# Patient Record
Sex: Female | Born: 1990 | Race: Black or African American | Hispanic: No | Marital: Single | Smoking: Never smoker
Health system: Southern US, Community
[De-identification: ages and names within clinical notes are randomized; demographics above are authoritative.]

## PROBLEM LIST (undated history)

## (undated) DIAGNOSIS — E538 Deficiency of other specified B group vitamins: Secondary | ICD-10-CM

## (undated) DIAGNOSIS — M419 Scoliosis, unspecified: Secondary | ICD-10-CM

## (undated) HISTORY — DX: Deficiency of other specified B group vitamins: E53.8

## (undated) HISTORY — PX: HAMMER TOE SURGERY: SHX385

## (undated) HISTORY — DX: Scoliosis, unspecified: M41.9

---
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
## 2011-04-09 ENCOUNTER — Emergency Department (HOSPITAL_COMMUNITY): Payer: BC Managed Care – HMO

## 2011-04-09 ENCOUNTER — Emergency Department (HOSPITAL_COMMUNITY)
Admission: EM | Admit: 2011-04-09 | Discharge: 2011-04-09 | Disposition: A | Discharge status: Home / Self Care | Payer: BC Managed Care – HMO | Attending: Emergency Medicine | Admitting: Emergency Medicine

## 2011-04-09 DIAGNOSIS — E876 Hypokalemia: Secondary | ICD-10-CM | POA: Insufficient documentation

## 2011-04-09 DIAGNOSIS — R55 Syncope and collapse: Secondary | ICD-10-CM | POA: Insufficient documentation

## 2011-04-09 DIAGNOSIS — E860 Dehydration: Secondary | ICD-10-CM | POA: Insufficient documentation

## 2011-04-09 DIAGNOSIS — R42 Dizziness and giddiness: Secondary | ICD-10-CM | POA: Insufficient documentation

## 2011-04-09 LAB — POCT I-STAT, CHEM 8
BUN: 14 mg/dL (ref 6–23)
Chloride: 105 mEq/L (ref 96–112)
Creatinine, Ser: 1 mg/dL (ref 0.50–1.10)
Sodium: 138 mEq/L (ref 135–145)
TCO2: 23 mmol/L (ref 0–100)

## 2011-04-09 LAB — URINALYSIS, ROUTINE W REFLEX MICROSCOPIC
Bilirubin Urine: NEGATIVE
Glucose, UA: NEGATIVE mg/dL
Hgb urine dipstick: NEGATIVE
Protein, ur: NEGATIVE mg/dL
Specific Gravity, Urine: 1.022 (ref 1.005–1.030)
Urobilinogen, UA: 0.2 mg/dL (ref 0.0–1.0)

## 2011-04-09 MED ORDER — POTASSIUM CHLORIDE CRYS ER 20 MEQ PO TBCR
40.0000 meq | EXTENDED_RELEASE_TABLET | Freq: Once | ORAL | Status: AC
  Administered 2011-04-09: 40 meq via ORAL
  Filled 2011-04-09: qty 2

## 2011-04-09 NOTE — ED Notes (Signed)
Pt was at school working out in the gym and states that everything went black and she passed out, it was witnessed but noone can tell us how long she was out

## 2011-04-09 NOTE — ED Provider Notes (Signed)
Medical screening examination/treatment/procedure(s) were performed by non-physician practitioner and as supervising physician I was immediately available for consultation/collaboration. Devoria Albe, MD, Armando Gang   Ward Givens, MD 04/09/11 2156

## 2011-04-09 NOTE — ED Provider Notes (Signed)
History     CSN: [id number] Arrival date & time: 04/09/2011  7:05 PM   First MD Initiated Contact with Patient 04/09/11 2003      Chief Complaint  Patient presents with  . Loss of Consciousness    (Consider location/radiation/quality/duration/timing/severity/associated sxs/prior treatment) The history is provided by the patient and a parent.  Pt is otherwise healthy young female who presents with complaints of syncopal episode while participating in work out group.  Pt has been to same work out in past.  Her syncopal episode was brief and preceded by lightheadedness and spots in vision.  Pt also felt some nausea.  She denies CP, palpitations, hemoptysis, headache.  She has felt well recently with no fever, chills, cough, N/V.  No family hx of earlier cardiac death or significant cardiac problems. Pt feels well now and denies any pain or head injury.  Pt denies hx of seizures, no incontinence, or tongue biting.  History reviewed. No pertinent past medical history.  History reviewed. No pertinent past surgical history.  History reviewed. No pertinent family history.  History  Substance Use Topics  . Smoking status: Never Smoker   . Smokeless tobacco: Not on file  . Alcohol Use: No    OB History    Grav Para Term Preterm Abortions TAB SAB Ect Mult Living                  Review of Systems  Constitutional: Negative for fever, chills, diaphoresis and fatigue.  Respiratory: Negative for cough, chest tightness, shortness of breath and wheezing.   Cardiovascular: Negative for chest pain, palpitations and leg swelling.  Gastrointestinal: Negative for vomiting.  Neurological: Positive for dizziness, syncope and light-headedness. Negative for numbness.  All other systems reviewed and are negative.    Allergies  Doxycycline  Home Medications   Current Outpatient Rx  Name Route Sig Dispense Refill  . THERA M PLUS PO TABS Oral Take 1 tablet by mouth daily.        BP 112/69   Pulse 83  Temp(Src) 98 F (36.7 C) (Oral)  Resp 18  SpO2 100%  LMP 03/11/2011  Physical Exam  Constitutional: She is oriented to person, place, and time. She appears well-developed and well-nourished. No distress.  HENT:  Head: Normocephalic and atraumatic.  Nose: Nose normal.  Mouth/Throat: Oropharynx is clear and moist.  Eyes: Conjunctivae are normal. Pupils are equal, round, and reactive to light.  Neck: Normal range of motion. No tracheal deviation present. No thyromegaly present.  Cardiovascular: Normal rate, regular rhythm and normal heart sounds.  Exam reveals no gallop and no friction rub.   No murmur heard. Pulmonary/Chest: Effort normal and breath sounds normal. No stridor.  Abdominal: Soft. Bowel sounds are normal. She exhibits no distension. There is no tenderness.  Musculoskeletal: Normal range of motion. She exhibits no edema.  Neurological: She is alert and oriented to person, place, and time.  Skin: Skin is warm and dry. No rash noted. No erythema.  Psychiatric: She has a normal mood and affect.    ED Course  Procedures (including critical care time)  Pt seen and evaluated.  Initial work up started.  Pt without symptoms at this time. Pt is PERC negative.    Labs Reviewed  URINALYSIS, ROUTINE W REFLEX MICROSCOPIC - Abnormal; Notable for the following:    Ketones, ur 15 (*)    All other components within normal limits  POCT I-STAT, CHEM 8 - Abnormal; Notable for the following:  Potassium 3.4 (*)    All other components within normal limits  POCT PREGNANCY, URINE  I-STAT, CHEM 8  POCT PREGNANCY, URINE   Dg Chest 2 View  04/09/2011  *RADIOLOGY REPORT*  Clinical Data: Syncope  CHEST - 2 VIEW  Comparison: 06/07/2005  Findings: Mild bronchitic changes.  Clear lungs.  Normal heart size.  No pleural effusion.  No pneumothorax.  IMPRESSION: Bronchitic changes.  Original Report Authenticated By: Donavan Burnet, M.D.     1. Vasovagal syncope       MDM   Pt  with single episode of syncope.  No symptoms now.  Negative urine pregnancy.  Slight dehydration on UA.  Slight hypokalemia with potassium given.  Normal H/H.  Pt meets [place] syncope score for low risk of adverse outcome.   ECG  Date: 04/09/2011  Rate: 65  Rhythm: normal sinus rhythm  QRS Axis: normal  Intervals: normal  ST/T Wave abnormalities: normal  Conduction Disutrbances:none  Narrative Interpretation:   Old EKG Reviewed: none available       Angus Seller, PA 04/09/11 2134  Angus Seller, PA 04/09/11 2139  Angus Seller, PA 04/09/11 2140

## 2011-04-09 NOTE — ED Notes (Signed)
Pt is not in the room at this time. Will get labs when pt returns. RN aware

## 2013-11-11 ENCOUNTER — Emergency Department (HOSPITAL_COMMUNITY)
Admission: EM | Admit: 2013-11-11 | Discharge: 2013-11-12 | Disposition: A | Discharge status: Home / Self Care | Payer: BC Managed Care – PPO | Attending: Emergency Medicine | Admitting: Emergency Medicine

## 2013-11-11 ENCOUNTER — Encounter (HOSPITAL_COMMUNITY): Payer: Self-pay | Admitting: Emergency Medicine

## 2013-11-11 DIAGNOSIS — N12 Tubulo-interstitial nephritis, not specified as acute or chronic: Secondary | ICD-10-CM | POA: Insufficient documentation

## 2013-11-11 DIAGNOSIS — R509 Fever, unspecified: Secondary | ICD-10-CM | POA: Insufficient documentation

## 2013-11-11 DIAGNOSIS — K5900 Constipation, unspecified: Secondary | ICD-10-CM | POA: Insufficient documentation

## 2013-11-11 DIAGNOSIS — Z888 Allergy status to other drugs, medicaments and biological substances status: Secondary | ICD-10-CM | POA: Insufficient documentation

## 2013-11-11 NOTE — ED Notes (Signed)
Pt states for the past couple of days she has been having abd pain  Pt states it initially started on her right side but over the coarse of the past day or so it has worked its way across her abdomen now both sides hurt  Pt denies N/V/D  Pt states she has not had a bowel movement in over a week   Mother states pt has been running a fever today

## 2013-11-12 LAB — COMPREHENSIVE METABOLIC PANEL
ALBUMIN: 4.2 g/dL (ref 3.5–5.2)
ALK PHOS: 53 U/L (ref 39–117)
ALT: 24 U/L (ref 0–35)
AST: 33 U/L (ref 0–37)
BILIRUBIN TOTAL: 0.9 mg/dL (ref 0.3–1.2)
BUN: 17 mg/dL (ref 6–23)
CHLORIDE: 103 meq/L (ref 96–112)
CO2: 23 mEq/L (ref 19–32)
Calcium: 9.5 mg/dL (ref 8.4–10.5)
Creatinine, Ser: 0.98 mg/dL (ref 0.50–1.10)
GFR calc Af Amer: >90 mL/min (ref >90)
GFR calc non Af Amer: 81 mL/min — ABNORMAL LOW (ref >90)
Glucose, Bld: 112 mg/dL — ABNORMAL HIGH (ref 70–99)
POTASSIUM: 3.8 meq/L (ref 3.7–5.3)
SODIUM: 138 meq/L (ref 137–147)
Total Protein: 8 g/dL (ref 6.0–8.3)

## 2013-11-12 LAB — CBC WITH DIFFERENTIAL/PLATELET
BASOS ABS: 0 10*3/uL (ref 0.0–0.1)
BASOS PCT: 0 % (ref 0–1)
Eosinophils Absolute: 0 10*3/uL (ref 0.0–0.7)
Eosinophils Relative: 0 % (ref 0–5)
HCT: 35 % — ABNORMAL LOW (ref 36.0–46.0)
HEMOGLOBIN: 11.8 g/dL — AB (ref 12.0–15.0)
Lymphocytes Relative: 3 % — ABNORMAL LOW (ref 12–46)
Lymphs Abs: 0.5 10*3/uL — ABNORMAL LOW (ref 0.7–4.0)
MCH: 31.1 pg (ref 26.0–34.0)
MCHC: 33.7 g/dL (ref 30.0–36.0)
MCV: 92.1 fL (ref 78.0–100.0)
Monocytes Absolute: 1.4 10*3/uL — ABNORMAL HIGH (ref 0.1–1.0)
Monocytes Relative: 9 % (ref 3–12)
NEUTROS ABS: 13.1 10*3/uL — AB (ref 1.7–7.7)
NEUTROS PCT: 88 % — AB (ref 43–77)
Platelets: 255 10*3/uL (ref 150–400)
RBC: 3.8 MIL/uL — ABNORMAL LOW (ref 3.87–5.11)
RDW: 12.2 % (ref 11.5–15.5)
WBC: 15 10*3/uL — ABNORMAL HIGH (ref 4.0–10.5)

## 2013-11-12 LAB — URINALYSIS, ROUTINE W REFLEX MICROSCOPIC
Bilirubin Urine: NEGATIVE
GLUCOSE, UA: NEGATIVE mg/dL
KETONES UR: NEGATIVE mg/dL
Nitrite: POSITIVE — AB
Specific Gravity, Urine: 1.019 (ref 1.005–1.030)
Urobilinogen, UA: 1 mg/dL (ref 0.0–1.0)
pH: 6.5 (ref 5.0–8.0)

## 2013-11-12 LAB — URINE MICROSCOPIC-ADD ON

## 2013-11-12 LAB — PREGNANCY, URINE: Preg Test, Ur: NEGATIVE

## 2013-11-12 LAB — LIPASE, BLOOD: Lipase: 26 U/L (ref 11–59)

## 2013-11-12 MED ORDER — DEXTROSE 5 % IV SOLN
1.0000 g | Freq: Once | INTRAVENOUS | Status: AC
  Administered 2013-11-12: 1 g via INTRAVENOUS
  Filled 2013-11-12: qty 10

## 2013-11-12 MED ORDER — SODIUM CHLORIDE 0.9 % IV BOLUS (SEPSIS)
1000.0000 mL | Freq: Once | INTRAVENOUS | Status: AC
  Administered 2013-11-12: 1000 mL via INTRAVENOUS

## 2013-11-12 MED ORDER — CIPROFLOXACIN IN D5W 400 MG/200ML IV SOLN
400.0000 mg | Freq: Once | INTRAVENOUS | Status: AC
  Administered 2013-11-12: 400 mg via INTRAVENOUS
  Filled 2013-11-12: qty 200

## 2013-11-12 MED ORDER — CIPROFLOXACIN HCL 500 MG PO TABS: 500.0000 mg | ORAL_TABLET | Freq: Two times a day (BID) | ORAL | Status: DC

## 2013-11-12 MED ORDER — ACETAMINOPHEN 325 MG PO TABS
650.0000 mg | ORAL_TABLET | Freq: Once | ORAL | Status: AC
  Administered 2013-11-12: 650 mg via ORAL
  Filled 2013-11-12: qty 2

## 2013-11-12 MED ORDER — MORPHINE SULFATE 4 MG/ML IJ SOLN
4.0000 mg | Freq: Once | INTRAMUSCULAR | Status: AC
  Administered 2013-11-12: 4 mg via INTRAVENOUS
  Filled 2013-11-12: qty 1

## 2013-11-12 MED ORDER — ONDANSETRON 8 MG PO TBDP: 8.0000 mg | ORAL_TABLET | Freq: Three times a day (TID) | ORAL | Status: DC | PRN

## 2013-11-12 MED ORDER — OXYCODONE-ACETAMINOPHEN 5-325 MG PO TABS: 2.0000 | ORAL_TABLET | ORAL | Status: DC | PRN

## 2013-11-12 MED ORDER — POLYETHYLENE GLYCOL 3350 17 G PO PACK: 17.0000 g | PACK | Freq: Every day | ORAL | Status: DC

## 2013-11-12 MED ORDER — ONDANSETRON HCL 4 MG/2ML IJ SOLN
4.0000 mg | Freq: Once | INTRAMUSCULAR | Status: AC
Start: 2013-11-12 — End: 2013-11-12
  Administered 2013-11-12: 4 mg via INTRAVENOUS
  Filled 2013-11-12: qty 2

## 2013-11-12 NOTE — ED Provider Notes (Signed)
CSN: [id number]     Arrival date & time 11/11/13  2304 History   First MD Initiated Contact with Patient 11/12/13 0024     Chief Complaint  Patient presents with  . Constipation  . Abdominal Pain  . Fever     (Consider location/radiation/quality/duration/timing/severity/associated sxs/prior Treatment) HPI 23 year old female presents to the emergency room from home with complaints of abdominal pain fever nausea and constipation.  Patient reports right-sided abdominal pain started 3-4 days ago.  Pain is now radiated around to her back.  Temp at home to 102.  Last bowel movement about a week ago, which is unusual.  She reports normally she has a bowel movement every 2-3 days.  Patient reports she's had increased urination, with some dysuria.  She denies any vaginal discharge.  No cough no chest pain.  No sick contacts no unusual foods no travel.   History reviewed. No pertinent past medical history. History reviewed. No pertinent past surgical history. Family History  Problem Relation Age of Onset  . Hypertension Other    History  Substance Use Topics  . Smoking status: Never Smoker   . Smokeless tobacco: Not on file  . Alcohol Use: No   OB History   Grav Para Term Preterm Abortions TAB SAB Ect Mult Living                 Review of Systems  See History of Present Illness; otherwise all other systems are reviewed and negative   Allergies  Doxycycline  Home Medications   Prior to Admission medications   Medication Sig Start Date End Date Taking? Authorizing Provider  Multiple Vitamins-Minerals (MULTIVITAMINS THER. W/MINERALS) TABS Take 1 tablet by mouth daily.      Historical Provider, MD   BP 109/62  Pulse 100  Temp(Src) 101.2 F (38.4 C) (Oral)  Resp 16  Ht 5\' 4"  (1.626 m)  Wt 127 lb (57.607 kg)  BMI 21.79 kg/m2  SpO2 99%  LMP 10/20/2013 Physical Exam  Nursing note and vitals reviewed. Constitutional: She is oriented to person, place, and time. She appears  well-developed and well-nourished.  HENT:  Head: Normocephalic and atraumatic.  Nose: Nose normal.  Mouth/Throat: Oropharynx is clear and moist.  Eyes: Conjunctivae and EOM are normal. Pupils are equal, round, and reactive to light.  Neck: Normal range of motion. Neck supple. No JVD present. No tracheal deviation present. No thyromegaly present.  Cardiovascular: Normal rate, regular rhythm, normal heart sounds and intact distal pulses.  Exam reveals no gallop and no friction rub.   No murmur heard. Pulmonary/Chest: Effort normal and breath sounds normal. No stridor. No respiratory distress. She has no wheezes. She has no rales. She exhibits no tenderness.  Abdominal: Soft. Bowel sounds are normal. She exhibits no distension and no mass. There is no tenderness. There is no rebound and no guarding.  Right CVA tenderness, right upper and lower abdominal pain without rebound or guarding.  Musculoskeletal: Normal range of motion. She exhibits no edema and no tenderness.  Lymphadenopathy:    She has no cervical adenopathy.  Neurological: She is alert and oriented to person, place, and time. She exhibits normal muscle tone. Coordination normal.  Skin: Skin is warm and dry. No rash noted. No erythema. No pallor.  Psychiatric: She has a normal mood and affect. Her behavior is normal. Judgment and thought content normal.    ED Course  Procedures (including critical care time) Labs Review Labs Reviewed  CBC WITH DIFFERENTIAL - Abnormal; Notable for  the following:    WBC 15.0 (*)    RBC 3.80 (*)    Hemoglobin 11.8 (*)    HCT 35.0 (*)    Neutrophils Relative % 88 (*)    Neutro Abs 13.1 (*)    Lymphocytes Relative 3 (*)    Lymphs Abs 0.5 (*)    Monocytes Absolute 1.4 (*)    All other components within normal limits  COMPREHENSIVE METABOLIC PANEL - Abnormal; Notable for the following:    Glucose, Bld 112 (*)    GFR calc non Af Amer 81 (*)    All other components within normal limits   URINALYSIS, ROUTINE W REFLEX MICROSCOPIC - Abnormal; Notable for the following:    Color, Urine ORANGE (*)    APPearance TURBID (*)    Hgb urine dipstick LARGE (*)    Protein, ur >300 (*)    Nitrite POSITIVE (*)    Leukocytes, UA LARGE (*)    All other components within normal limits  URINE MICROSCOPIC-ADD ON - Abnormal; Notable for the following:    Squamous Epithelial / LPF FEW (*)    Bacteria, UA MANY (*)    All other components within normal limits  URINE CULTURE  LIPASE, BLOOD  PREGNANCY, URINE    Imaging Review No results found.   EKG Interpretation None      MDM   Final diagnoses:  Pyelonephritis  Constipation    23 year old female with right flank and abdominal pain.  Differential includes pyelonephritis, cholecystitis, appendicitis, gastroenteritis, colitis.  Plan for labs, Tylenol fluids pain and nausea medicine and we'll reassess.   3:46 AM Patient feeling better after medications.  No further vomiting.  Labs show elevated white blood cell count and urine positive for urinary tract infection.  Given right flank pain, suspect pyelonephritis.  As patient is tolerating by mouth fluids, feel that she is okay for discharge at this time, and she and her parents have been given strict return precautions  Olivia Mackielga M Demarus Latterell, MD 11/12/13 [phone]

## 2013-11-12 NOTE — Discharge Instructions (Signed)
Pyelo  Take antibiotics as prescribed.  Take pain and nausea medication as needed.  Return to the ER if you are unable to keep down the antibiotics, if you have continued fever, vomiting despite the medication, or other concerns.  Follow up with your doctor for recheck in 3-5 days.  If you do not have a doctor, follow up with one of the numbers listed  PAIN ACETAMINOPHEN OXYCODONE  PAIN ACETAMINOPHEN OXYCODONE: You have been given a medication that contains acetaminophen and oxycodone.      This medication is used to relieve pain.     DO NOT take this medication if you have liver disease or drink alcohol on a daily basis.     DO NOT take this medication if you are taking other over-the-counter medications that contain Tylenol or acetaminophen (the active ingredient in Tylenol).     If you have side-effects that you think are caused by this medicine, tell your doctor.     DO NOT drink alcoholic beverages while taking this medicine.     If you become dizzy, sit or lie down at the first signs.  You should be careful going up and down stairs.     If you are pregnant or breastfeeding, notify your doctor before taking this medication.     Keep this medication out of the reach of children.  Always keep this medication in child-proof containers.  DO NOT give your medication to anyone else. This medication can be HABIT-FORMING.  Discontinue use when no longer needed and never give this medication to others.  You have been given a medication, or a prescription for a medication, that causes drowsiness or dizziness.  DO NOT drive a car, operate machinery, or perform jobs that require you to be alert until you know how you are going to react to this medicine.  THESE INSTRUCTIONS ARE NOT COMPREHENSIVE (complete):  Ask your pharmacist for additional information and precautions for this medication.   GI ANTIEMETIC  GI ANTIEMETIC: You have been given a prescription for a medication for nausea and  vomiting.      It is OK to take this medication if you are pregnant.  Be sure to tell your regular doctor or obstetrician [facility] doctor) that you have been taking this medication.     Take this medication as directed.     If you are taking phenobarbital, narcotic pain medications, antidepressants, or sleeping pills your dosage may need to be adjusted.  Be sure to inform your doctor of all the other medications that you are taking.     DO NOT take this medication if you have liver disease or heart disease.     DO NOT take pain killers (narcotic medication) unless specifically instructed to do so by your doctor     DO NOT drink alcoholic beverages while taking this medicine.     If you develop any reactions that you believe may be from the medication be sure to tell your doctor or return to the ER (Some reactions may include:  dizziness, shaking, visual disturbances, nervousness, fainting, rash).     If you become dizzy, sit or lie down at the first signs.  You should be careful going up and down stairs.     Keep this medication out of the reach of children.  Always keep this medication in child-proof containers.  DO NOT give your medication to anyone else. You have been given a medication, or a prescription for a medication, that causes drowsiness  or dizziness.  DO NOT drive a car, operate machinery, ride a bike, or perform jobs that require you to be alert until you know how you are going to react to this medication.  THESE INSTRUCTIONS ARE NOT COMPREHENSIVE (complete):  Ask your pharmacist for additional information and precautions for this medication.   ANTIBIOTIC FLUOROQUINOLONES  ANTIBIOTIC FLUOROQUINOLONES: You have been prescribed an antibiotic that belongs to a class of drugs called Fluoroquinolones.      Take this medication exactly as prescribed.  If you forget a dose, do not double your next dose.     This medication treats many kinds of infections of the skin, bone, stomach,  brain, blood, lungs, ear, and urinary tract.  It also treats certain sexually transmitted diseases.     DO NOT take this medication if you have an allergy to it or to other similar medications.     Take this medication until it is gone.  DO NOT stop taking this medication after a few doses, even if you are feeling better.     Keep this medication out of the reach of children.  Always keep this medication in child-proof containers.  DO NOT give your medication to anyone else.     You may take this medication with food or milk.  To help prevent complications, drink extra fluid while taking this medication. Keep this medication away from children.  IT IS VERY IMPORTANT that you finish all the medication in this prescription, since the medicine is used to treat an ongoing infection in your body.  If you are taking any of the following medications, please notify your doctor  before taking this medication:      Blood Thinners (Coumadin), Cyclosporin, Arsenic Trioxide, Erythromycin, medicines for depression or mental illness such as Prozac, Amitriptyline, Imipramine, Haldol, Geodon, or Mellaril, anti-inflammatory drugs such as Ibuprofen, Aspirin, Aleve, or Relafen, steroids, diuretics or "water pills", or medications used to treat abnormal heart rhythms such as Amiodarone, Betapace, Corvert, Norpace, or Sotalol.     If you are using antacids, multivitamins, or sucralfate, take these medications at least 8 hours before or 4 hours after taking this medication. Make sure your doctor knows if you are pregnant or breastfeeding, have a history of heart disease, heart rhythm problems, kidney disease, liver disease, low potassium, stroke, seizures, or a family history of heart rhythm disorders.      Possible side-effects of this medication are an allergic reaction (itching or hives, swelling of the face or hands, chest tightness, trouble breathing), chest pain, heart rhythm changes, diarrhea, seizures,  yellowing of the eyes or skin, and unusual bleeding or bruising.  Please contact your doctor immediately if you experience any of these side-effects.     Some less serious side-effects are dizziness, nervousness, anxiety, agitation, muscle or joint pain, nausea or vomiting, sores or white patches in your mouth, and vaginal itching or discharge.  Notify your doctor if you experience any of these symptoms. THESE INSTRUCTIONS ARE NOT COMPREHENSIVE (complete):  Ask your pharmacist for additional information and precautions for this medication.   PYELONEPHRITIS  PYELONEPHRITIS: You have been diagnosed with pyelonephritis, also known as a kidney infection.  Pyelonephritis is a bacterial infection in your kidneys. Symptoms include fever, chills, nausea and vomiting, back pain on one or both sides, and sometimes difficulty urinating. You may also feel very weak. The diagnosis is made based on your symptoms and a test of your urine.  Pyelonephritis most commonly occurs when a lower urinary tract  infection (bladder infection) ascends (climbs up) the urinary tract and gets into the kidney. Early treatment of a bladder infection is important to prevent pyelonephritis and the possibility of kidney damage.  Pyelonephritis is treated with antibiotics, pain and fever medications, and fluids. Some patients require an "IV" if they have nausea or vomiting and cannot keep down the antibiotics, or if they arent improving after 2-3 days of oral (by mouth) medications.  Most patients do not need to be admitted to the hospital for pyelonephritis.  A few patients who dont do well with the oral (by mouth) antibiotics or become sicker despite treatment may need to return to be admitted to the hospital.  YOU SHOULD SEEK MEDICAL ATTENTION IMMEDIATELY, EITHER HERE OR AT THE NEAREST EMERGENCY DEPARTMENT, IF ANY OF THE FOLLOWING OCCURS:      Failure to improve after 2-3 days of antibiotics.     You develop nausea or  vomiting and are unable to keep down medications or fluids.     Increased weakness or lightheadedness.     You develop any progressive or worsening symptoms or any other concerns.  IMPORTANCE OF PRIMARY CARE DOCTOR (EDU)  IMPORTANCE OF PRIMARY CARE DOCTOR (EDU): You have been given instructions to follow up with a primary care physician.  A primary care physician is a doctor who helps with your health maintenance. For example, he or she provides yearly health exams to help determine your general well-being along with regular check-ups to help to identify potential health problems.  Your primary care physician serves as a main resource on all aspects of your health. In addition to treating existing medical conditions, this physician monitors your health over time. Your primary doctor can help you to recognize symptoms, or changes in your body that could be signs of new illness. Primary care physicians can look at the big picture, including your lifestyle and family history. They can help plan the best ways of staying healthy and leading a long, productive life. They are also an important part in making referrals to specialists (such as doctors who specialize in specific disease conditions such as diabetes, heart disease, etc.).  There are many types of physicians who provide primary care. They all offer the benefits of a lasting, personal relationship based upon mutual trust and a thorough knowledge of an individual person. They also provide a wide range of healthcare services.      Family Medicine physicians provide comprehensive care for all family members, from newborns through older adults.     Internal Medicine physicians specialize in meeting the complete healthcare needs of adults, from teenagers through seniors, providing both primary and advanced levels of care.     Obstetrician/gynecologists often serve as primary physicians for women, performing routine physicals and health screenings  in addition to obstetrical and gynecological care.     Pediatricians are experts in primary care for children, usually from infancy through the teen years. Primary care doctors may be either MDs or DOs. With today's modern medical training, the differences between an MD (Medical Doctor) and a D.O. (Doctor of Osteopathic Medicine) are minimal. Both MD's and DOs go to medical school and complete residencies in various medical specialties.  If you do not have a primary care physician, it takes a little homework and determination on your part. There are several options available in selecting the most appropriate doctor for your care. There are referrals lines in your local area as well as specialists that work with your specific health care  plan. Many people find a physician through word-of-mouth, asking their friends, neighbors or relatives. There are also referral lines in your local area. Hospital physician referral services are also another option. Your health care plan may also offer referral services and most health plans offer the "Ask A Nurse" service. Referral services offer backgrounds of potential physicians, their educational and practice history, age range, office locations and hours, and the types of insurance coverage that they accept.  When you have decided which doctor may be right for you, make an appointment to ask questions about issues that are important to you. Frequently asked questions include the following:      Is the doctor on staff at a hospital? Which hospital?     What is the doctor's educational background?     Does the doctor specialize in certain areas of medicine?     How many years has his or her practice been established?     Is the doctor in practice by himself or herself, or in a group practice?     Is his or her office conveniently located?     What hours are available for appointments?     What types of insurance coverage does the doctor accept?     If  you're on Medicare or Medicaid, does the doctor accept these plans?     How far in advance do you have to make an appointment? Are same-day appointments available?     How does the doctor handle situations when you need to see a doctor urgently?     What is the doctor's fee schedule? When is payment expected and how can it be made? When seeing a patient for the first time in a non-emergency situation, most doctors will begin a medical chart. This chart includes information about your health history. This record should include your present state of health, personal statistics (age, height, weight, occupation, whether you're a smoker or non-smoker), and your family history.  Establishing a GOOD RAPPORT (relationship) with your family doctor is EXTREMELY important! A PCP (Primary Care Physician) is the cornerstone of your care and should be the first person you call with any health concerns or problems. Being an established patient is VERY important so that you can be seen quickly when an illness or injury does occur. Plan ahead and make an appointment with your chosen physician to become an established patient of his or her practice.   Emergency Department Resource Guide 1) Find a Doctor and Pay Out of Pocket Although you won't have to find out who is covered by your insurance plan, it is a good idea to ask around and get recommendations. You will then need to call the office and see if the doctor you have chosen will accept you as a new patient and what types of options they offer for patients who are self-pay. Some doctors offer discounts or will set up payment plans for their patients who do not have insurance, but you will need to ask so you aren't surprised when you get to your appointment.  2) Contact Your Local Health Department Not all health departments have doctors that can see patients for sick visits, but many do, so it is worth a call to see if yours does. If you don't know where your  local health department is, you can check in your phone book. The CDC also has a tool to help you locate your state's health department, and many state websites also have listings of  all of their local health departments.  3) Find a Walk-in Clinic If your illness is not likely to be very severe or complicated, you may want to try a walk in clinic. These are popping up all over the country in pharmacies, drugstores, and shopping centers. They're usually staffed by nurse practitioners or physician assistants that have been trained to treat common illnesses and complaints. They're usually fairly quick and inexpensive. However, if you have serious medical issues or chronic medical problems, these are probably not your best option.  No Primary Care Doctor: - Call Health Connect at  [phone] - they can help you locate a primary care doctor that  accepts your insurance, provides certain services, etc. - Physician Referral Service- [phone]  Chronic Pain Problems: Organization         Address  Phone   Notes  [facility]  [phone] Patients need to be referred by their primary care doctor.   Medication Assistance: Organization         Address  Phone   Notes  [facility] Medication [facility] [address] [place], [place] [address] [phone] --Must be a resident of [facility] -- Must have NO insurance coverage whatsoever (no Medicaid/ Medicare, etc.) -- The pt. MUST have a primary care doctor that directs their care regularly and follows them in the community   [facility]  [phone]   [employer]  [phone]    Agencies that provide inexpensive medical care: Organization         Address  Phone   Notes  [facility]  [phone]   [facility]    [phone]   [facility] [address] [place], [place] [address] [phone]   [facility] [address] [place]  [address], [place] [phone]   Planned Parenthood    [phone]   [facility]    [phone]   [facility]  [address], [place] Phone:  [phone], Fax:  [phone] Hours of Operation:  9 am - 6 pm, M-F.  Also accepts Medicaid/Medicare and self-pay.  [facility]  [address], [place] Phone: [phone], Fax: [phone]. Hours of Operation:  8:30 am - 5:30 pm, M-F.  Also accepts Medicaid and self-pay.  [facility] [place] [address] [place] Phone: [phone]   [facility] [address] Natasha Bence [place], [place] [phone], Ext. [phone] Mondays & Thursdays: 7-9 AM.  First 15 patients are seen on a first come, first serve basis.    Medicaid-accepting [facility] Providers:  Organization         Address  Phone   Notes  [facility] [address], [place] [phone] Also accepts self-pay patients.  [facility] [address] Laurell Josephs [place], [place]  [phone]   [facility] [address], [place] [phone]   [facility] [address] [place] [phone]   Renaye Rakers [address], [place]   [phone] Only accepts [place] patients after they have their name applied to their card.   Self-Pay (no insurance) in [facility]:  Organization         Address  Phone   Notes  Sickle  Cell Patients, [facility] [address], [place] [phone]   [facility] [address], [place] [phone]   [facility]  [address], [place] [phone]) [phone]   [facility]/Dr. Osei-Bonsu  [address] [place] [address], [place] or [phone], [place] [phone] Phone number for both [place] and [place] locations is the same.  [facility] [address], [place] [phone]) [phone]   [facility] [address] [place] [address], [place] or [address] [phone]   [facility] [address], [place] [phone]) [phone], phone; [phone]) [phone], fax Sees patients 1st and 3rd Saturday of every month.  Must not qualify for public or private insurance (i.e. Medicaid, Medicare, [place] Health Choice, Veterans' Benefits)  Household income should be no more than 200% of the poverty level The clinic cannot treat you if you are pregnant or think you are pregnant  Sexually transmitted diseases are not treated at the clinic.    Dental Care: Organization         Address  Phone  Notes  [facility] [address], [place] [phone] Accepts children up to age 22 who are enrolled in [place] or [place] Health Choice; pregnant women with a Medicaid card; and children who have applied for Medicaid or [place] Health Choice, but were declined, whose parents can pay a reduced fee at time of service.  [facility] [place]  [address], [place] [phone] Accepts children up to age 22 who are enrolled in [place] or [place] Health Choice; pregnant women with a Medicaid card; and children who have applied for Medicaid or [place] Health Choice, but were declined, whose parents can pay a reduced fee at time of service.  [facility]  [address], [place] [phone] Patients are seen by appointment only. Walk-ins are not accepted. [facility] will see patients 218 years of age and older. Monday - Tuesday (8am-5pm) Most Wednesdays (8:30-5pm) $30 per visit, cash only  [facility]  [address], [facility] [phone] Patients are seen by appointment only. Walk-ins are not  accepted. [facility] will see patients 118 years of age and older. One Wednesday Evening (Monthly: Volunteer Based).  $30 per visit, cash only  [employer] [facility] [facility]  [phone] for adults; Children under age 234, call Graduate Pediatric Dentistry at [phone]. Children aged 244-14, please call [phone] to request a pediatric application.  Dental services are provided in all areas of dental care including fillings, crowns and bridges, complete and partial dentures, implants, gum treatment, root canals, and extractions. Preventive care is also provided. Treatment is provided to both adults and children. Patients are selected via a lottery and there is often a waiting list.   [facility] [address], [place]  [phone] [email or web address]   [facility] [address], [place], [place] [phone], Ext. [phone] Second and Fourth Thursday of each month, opens at 6:30 AM; Clinic ends at 9 AM.  Patients are seen on a first-come first-served basis, and a limited number are seen during each clinic.   [facility]  [address], [place], [place] [phone]) [phone]   Eligibility Requirements  You must have lived in [place], [place], or [place] counties for at least the last three months.   You cannot be eligible for state or federal sponsored [place], including [employer], [place], or [employer].   You generally cannot be eligible for healthcare insurance through your employer.    How to apply: Eligibility screenings are held every Tuesday and Wednesday afternoon from 1:00 pm until 4:00 pm. You do not need an appointment for the interview!  [facility] [address] [place], [place] [phone]   [facility]  [phone]   [facility] Health Department  [phone]   [facility] Health Department  [phone]    Behavioral Health Resources in the  Community: Intensive Outpatient Programs Organization         Address  Phone  Notes  [facility] [address] [place], [place] [phone]   [facility] [address], [place], [place] [phone]   [facility] [address] [place], [place]  [phone]   [facility] [address]  [place], [place] [phone] or [phone]   Substance Abuse Resources Organization         Address  Phone  Notes  [facility]  [phone]   [facility]  [phone]   [facility] [place]  [phone]   Floydene Flock  [phone]   Residential & Outpatient Substance Abuse Program  [phone]   Psychological Services Organization         Address  Phone  Notes  [facility]  [phone]   [facility] Services  [phone]   [facility] [address], [place] [phone] or [phone]    Mobile Crisis Teams Organization         Address  Phone  Notes  [facility], Mobile Crisis Care Unit  [phone]   [facility]  [address] [place], [place] [phone]   Doristine Locks [address] [place] [phone]    Self-Help/Support Groups Organization         Address  Phone             Notes  [facility] - variety of support groups  336- [phone] Call for more information  Narcotics Anonymous (NA), [facility] [address], [employer] [place]  2 meetings at this location   Statistician         Address  Phone  Notes  [facility] [address],    [place]  [phone]   [facility]  [address], [place] [address], [place], [place] [phone]   [facility] [address] [place] [address] [place], [place] [phone] Admissions: 8am-3pm M-F  [facility] [address],    [place], [place] [phone]   [facility] [address] [place] [place], [place] [phone]   [facility] [address]  [place], [place] [phone]   [facility] Int[facility] [address], [place], [place] [phone]   [facility] [address] [place]  [place], [place] [phone] or [phone]   [facility] [address] [place], [place] [phone] Accepts Medicaid  [facility] [address],  [place] [phone] Substance Abuse/Addiction Treatment   [facility] Resources Organization  Address  Phone  Notes  °[facility]  [phone]   °Julie Brannon, PhD [address] [place], [place]   [phone]) [phone] or [phone]) [phone]   °[facility]   [address] °[place], [place] [phone]   °[facility] [address], [place], [place] [phone] Insurance/Medicaid/sponsorship through [facility]  °[facility] [address]                                    [place], [place] [phone] Therapy/tele-psych/case  °[facility] [address]  ° [place], [place] [phone]    °Dr. Arfeen  [phone]   °[facility] [place]  [employer] [facility] 1) [address], [place] °2) [address], [place] °3)  [address], [place] [phone] °[phone] ° °[phone]   °[place] Child Abuse Hotline [phone]) 342-1394 or [phone]) [phone] (After Hours)    ° ° ° °

## 2013-11-13 LAB — URINE CULTURE

## 2013-11-14 ENCOUNTER — Emergency Department (HOSPITAL_COMMUNITY)
Admission: EM | Admit: 2013-11-14 | Discharge: 2013-11-15 | Disposition: A | Discharge status: Home / Self Care | Payer: BC Managed Care – PPO | Attending: Emergency Medicine | Admitting: Emergency Medicine

## 2013-11-14 ENCOUNTER — Telehealth (HOSPITAL_BASED_OUTPATIENT_CLINIC_OR_DEPARTMENT_OTHER): Payer: Self-pay | Admitting: Emergency Medicine

## 2013-11-14 ENCOUNTER — Encounter (HOSPITAL_COMMUNITY): Payer: Self-pay | Admitting: Emergency Medicine

## 2013-11-14 ENCOUNTER — Emergency Department (HOSPITAL_COMMUNITY): Payer: BC Managed Care – PPO

## 2013-11-14 DIAGNOSIS — Z792 Long term (current) use of antibiotics: Secondary | ICD-10-CM | POA: Insufficient documentation

## 2013-11-14 DIAGNOSIS — Z79899 Other long term (current) drug therapy: Secondary | ICD-10-CM | POA: Insufficient documentation

## 2013-11-14 DIAGNOSIS — R109 Unspecified abdominal pain: Secondary | ICD-10-CM | POA: Insufficient documentation

## 2013-11-14 DIAGNOSIS — N12 Tubulo-interstitial nephritis, not specified as acute or chronic: Secondary | ICD-10-CM

## 2013-11-14 LAB — CBC WITH DIFFERENTIAL/PLATELET
BASOS ABS: 0 10*3/uL (ref 0.0–0.1)
Basophils Relative: 0 % (ref 0–1)
Eosinophils Absolute: 0.1 10*3/uL (ref 0.0–0.7)
Eosinophils Relative: 1 % (ref 0–5)
HCT: 34.8 % — ABNORMAL LOW (ref 36.0–46.0)
Hemoglobin: 11.6 g/dL — ABNORMAL LOW (ref 12.0–15.0)
LYMPHS PCT: 16 % (ref 12–46)
Lymphs Abs: 1.4 10*3/uL (ref 0.7–4.0)
MCH: 31.1 pg (ref 26.0–34.0)
MCHC: 33.3 g/dL (ref 30.0–36.0)
MCV: 93.3 fL (ref 78.0–100.0)
Monocytes Absolute: 1 10*3/uL (ref 0.1–1.0)
Monocytes Relative: 12 % (ref 3–12)
NEUTROS ABS: 6.4 10*3/uL (ref 1.7–7.7)
Neutrophils Relative %: 71 % (ref 43–77)
PLATELETS: 239 10*3/uL (ref 150–400)
RBC: 3.73 MIL/uL — ABNORMAL LOW (ref 3.87–5.11)
RDW: 12.2 % (ref 11.5–15.5)
WBC: 9 10*3/uL (ref 4.0–10.5)

## 2013-11-14 LAB — URINALYSIS, ROUTINE W REFLEX MICROSCOPIC
Bilirubin Urine: NEGATIVE
GLUCOSE, UA: NEGATIVE mg/dL
Hgb urine dipstick: NEGATIVE
Ketones, ur: NEGATIVE mg/dL
Nitrite: NEGATIVE
PROTEIN: NEGATIVE mg/dL
Specific Gravity, Urine: 1.01 (ref 1.005–1.030)
UROBILINOGEN UA: 0.2 mg/dL (ref 0.0–1.0)
pH: 6 (ref 5.0–8.0)

## 2013-11-14 LAB — COMPREHENSIVE METABOLIC PANEL
ALT: 44 U/L — AB (ref 0–35)
AST: 39 U/L — AB (ref 0–37)
Albumin: 3.7 g/dL (ref 3.5–5.2)
Alkaline Phosphatase: 71 U/L (ref 39–117)
BILIRUBIN TOTAL: 0.5 mg/dL (ref 0.3–1.2)
BUN: 5 mg/dL — ABNORMAL LOW (ref 6–23)
CHLORIDE: 98 meq/L (ref 96–112)
CO2: 23 meq/L (ref 19–32)
Calcium: 9.4 mg/dL (ref 8.4–10.5)
Creatinine, Ser: 0.98 mg/dL (ref 0.50–1.10)
GFR calc Af Amer: >90 mL/min (ref >90)
GFR calc non Af Amer: 81 mL/min — ABNORMAL LOW (ref >90)
Glucose, Bld: 90 mg/dL (ref 70–99)
Potassium: 3.7 mEq/L (ref 3.7–5.3)
SODIUM: 135 meq/L — AB (ref 137–147)
Total Protein: 7.9 g/dL (ref 6.0–8.3)

## 2013-11-14 LAB — URINE MICROSCOPIC-ADD ON

## 2013-11-14 MED ORDER — DEXTROSE 5 % IV SOLN
1.0000 g | Freq: Once | INTRAVENOUS | Status: AC
  Administered 2013-11-14: 1 g via INTRAVENOUS
  Filled 2013-11-14: qty 10

## 2013-11-14 MED ORDER — CIPROFLOXACIN HCL 500 MG PO TABS: 500.0000 mg | ORAL_TABLET | Freq: Two times a day (BID) | ORAL | Status: DC

## 2013-11-14 MED ORDER — SODIUM CHLORIDE 0.9 % IV SOLN
INTRAVENOUS | Status: DC
  Administered 2013-11-14: 19:00:00 via INTRAVENOUS

## 2013-11-14 MED ORDER — SODIUM CHLORIDE 0.9 % IV BOLUS (SEPSIS)
1000.0000 mL | Freq: Once | INTRAVENOUS | Status: AC
  Administered 2013-11-14: 1000 mL via INTRAVENOUS

## 2013-11-14 MED ORDER — ONDANSETRON HCL 4 MG/2ML IJ SOLN
4.0000 mg | Freq: Once | INTRAMUSCULAR | Status: AC
  Administered 2013-11-14: 4 mg via INTRAVENOUS
  Filled 2013-11-14: qty 2

## 2013-11-14 MED ORDER — CEPHALEXIN 500 MG PO CAPS: 500.0000 mg | ORAL_CAPSULE | Freq: Four times a day (QID) | ORAL | Status: DC

## 2013-11-14 MED ORDER — HYDROMORPHONE HCL PF 1 MG/ML IJ SOLN
1.0000 mg | Freq: Once | INTRAMUSCULAR | Status: AC
  Administered 2013-11-14: 1 mg via INTRAVENOUS
  Filled 2013-11-14: qty 1

## 2013-11-14 MED ORDER — PROMETHAZINE HCL 25 MG PO TABS: 25.0000 mg | ORAL_TABLET | Freq: Four times a day (QID) | ORAL | Status: DC | PRN

## 2013-11-14 MED ORDER — CIPROFLOXACIN IN D5W 400 MG/200ML IV SOLN
400.0000 mg | Freq: Once | INTRAVENOUS | Status: AC
  Administered 2013-11-15: 400 mg via INTRAVENOUS
  Filled 2013-11-14: qty 200

## 2013-11-14 NOTE — Telephone Encounter (Signed)
Post ED Visit - Positive Culture Follow-up  Culture report reviewed by antimicrobial stewardship pharmacist: []  Wes Dulaney, Pharm.D., BCPS []  Celedonio MiyamotoJeremy Frens, Pharm.D., BCPS [x]  Georgina PillionElizabeth Martin, Pharm.D., BCPS []  HaroldMinh Pham, [address]D., BCPS, AAHIVP []  Estella HuskMichelle Turner, Pharm.D., BCPS, AAHIVP []  Harvie JuniorNathan Cope, Pharm.D.  Positive urine culture Treated with Cipro, organism sensitive to the same and no further patient follow-up is required at this time.  [place], Jenel LucksKylie 11/14/2013, 3:04 PM

## 2013-11-14 NOTE — ED Notes (Signed)
Patient presents with c/o right flank pain that radiates across her back. Patient was seen here a few days ago with same complaint. Patient began to get dizzy today and feeling weak. Patient states she feels like she has been running a fever as well. Pt c/o HA that started this morning as well. Patient too ibuprofen that did not take away the pain. Mother is concerned because patient is not better.

## 2013-11-14 NOTE — ED Provider Notes (Signed)
CSN: [id number]     Arrival date & time 11/14/13  1828 History   First MD Initiated Contact with Patient 11/14/13 1838     No chief complaint on file.    (Consider location/radiation/quality/duration/timing/severity/associated sxs/prior Treatment) The history is provided by the patient and a parent.   23 year old female. Seen on June 11 for symptoms similar which is being seen today. Diagnosis pyelonephritis. Started on Cipro. Patient sent home with Percocet and of Zofran. Patient's been taking her Cipro but does not feel that she is improved at all. Patient had fevers yesterday still with some nausea and vomiting still with right flank pain and right CVA pain and some suprapubic pain. Patient states the pain is 8/10. Described as an ache.  History reviewed. No pertinent past medical history. History reviewed. No pertinent past surgical history. Family History  Problem Relation Age of Onset  . Hypertension Other    History  Substance Use Topics  . Smoking status: Never Smoker   . Smokeless tobacco: Not on file  . Alcohol Use: No   OB History   Grav Para Term Preterm Abortions TAB SAB Ect Mult Living                 Review of Systems  Constitutional: Positive for fever.  HENT: Negative for congestion.   Eyes: Negative for visual disturbance.  Respiratory: Negative for shortness of breath.   Cardiovascular: Negative for chest pain.  Gastrointestinal: Positive for nausea, vomiting and abdominal pain.  Genitourinary: Positive for flank pain. Negative for dysuria and vaginal discharge.  Musculoskeletal: Positive for back pain.  Skin: Negative for rash.  Neurological: Negative for headaches.  Hematological: Does not bruise/bleed easily.  Psychiatric/Behavioral: Negative for confusion.      Allergies  Doxycycline  Home Medications   Prior to Admission medications   Medication Sig Start Date End Date Taking? Authorizing Provider  ciprofloxacin (CIPRO) 500 MG tablet Take 1  tablet (500 mg total) by mouth 2 (two) times daily. 11/12/13  Yes Olivia Mackielga M Otter, MD  LO LOESTRIN FE 1 MG-10 MCG / 10 MCG tablet Take 1 tablet by mouth every morning. 10/28/13  Yes Historical Provider, MD  ondansetron (ZOFRAN ODT) 8 MG disintegrating tablet Take 1 tablet (8 mg total) by mouth every 8 (eight) hours as needed for nausea or vomiting. 11/12/13  Yes Olivia Mackielga M Otter, MD  oxyCODONE-acetaminophen (PERCOCET/ROXICET) 5-325 MG per tablet Take 2 tablets by mouth every 4 (four) hours as needed for severe pain. 11/12/13  Yes Olivia Mackielga M Otter, MD  polyethylene glycol [facility](MIRALAX / GLYCOLAX) packet Take 17 g by mouth daily. 11/12/13  Yes Olivia Mackielga M Otter, MD  cephALEXin (KEFLEX) 500 MG capsule Take 1 capsule (500 mg total) by mouth 4 (four) times daily. 11/14/13   Vanetta MuldersScott Madalyne Husk, MD  promethazine (PHENERGAN) 25 MG tablet Take 1 tablet (25 mg total) by mouth every 6 (six) hours as needed for nausea or vomiting. 11/14/13   Vanetta MuldersScott Elanah Osmanovic, MD   BP 122/82  Pulse 93  Temp(Src) 98.5 F (36.9 C) (Oral)  Resp 16  SpO2 96%  LMP 10/20/2013 Physical Exam  Nursing note and vitals reviewed. Constitutional: She is oriented to person, place, and time. She appears well-developed and well-nourished. No distress.  HENT:  Head: Normocephalic and atraumatic.  Mouth/Throat: Oropharynx is clear and moist.  Eyes: Conjunctivae and EOM are normal. Pupils are equal, round, and reactive to light.  Neck: Normal range of motion.  Cardiovascular: Normal rate and normal heart sounds.  No murmur heard. Pulmonary/Chest: Effort normal and breath sounds normal. No respiratory distress.  Abdominal: Soft. Bowel sounds are normal. There is no tenderness.  Musculoskeletal: Normal range of motion.  Neurological: She is alert and oriented to person, place, and time. No cranial nerve deficit. She exhibits normal muscle tone. Coordination normal.  Skin: Skin is warm. No rash noted.    ED Course  Procedures (including critical care time) Labs  Review Labs Reviewed  URINALYSIS, ROUTINE W REFLEX MICROSCOPIC - Abnormal; Notable for the following:    Leukocytes, UA TRACE (*)    All other components within normal limits  CBC WITH DIFFERENTIAL - Abnormal; Notable for the following:    RBC 3.73 (*)    Hemoglobin 11.6 (*)    HCT 34.8 (*)    All other components within normal limits  COMPREHENSIVE METABOLIC PANEL - Abnormal; Notable for the following:    Sodium 135 (*)    BUN 5 (*)    AST 39 (*)    ALT 44 (*)    GFR calc non Af Amer 81 (*)    All other components within normal limits  URINE CULTURE  URINE MICROSCOPIC-ADD ON   Results for orders placed during the hospital encounter of 11/14/13  URINALYSIS, ROUTINE W REFLEX MICROSCOPIC      Result Value Ref Range   Color, Urine YELLOW  YELLOW   APPearance CLEAR  CLEAR   Specific Gravity, Urine 1.010  1.005 - 1.030   pH 6.0  5.0 - 8.0   Glucose, UA NEGATIVE  NEGATIVE mg/dL   Hgb urine dipstick NEGATIVE  NEGATIVE   Bilirubin Urine NEGATIVE  NEGATIVE   Ketones, ur NEGATIVE  NEGATIVE mg/dL   Protein, ur NEGATIVE  NEGATIVE mg/dL   Urobilinogen, UA 0.2  0.0 - 1.0 mg/dL   Nitrite NEGATIVE  NEGATIVE   Leukocytes, UA TRACE (*) NEGATIVE  CBC WITH DIFFERENTIAL      Result Value Ref Range   WBC 9.0  4.0 - 10.5 K/uL   RBC 3.73 (*) 3.87 - 5.11 MIL/uL   Hemoglobin 11.6 (*) 12.0 - 15.0 g/dL   HCT 16.134.8 (*) 09.636.0 - 04.546.0 %   MCV 93.3  78.0 - 100.0 fL   MCH 31.1  26.0 - 34.0 pg   MCHC 33.3  30.0 - 36.0 g/dL   RDW 40.912.2  81.111.5 - 91.415.5 %   Platelets 239  150 - 400 K/uL   Neutrophils Relative % 71  43 - 77 %   Neutro Abs 6.4  1.7 - 7.7 K/uL   Lymphocytes Relative 16  12 - 46 %   Lymphs Abs 1.4  0.7 - 4.0 K/uL   Monocytes Relative 12  3 - 12 %   Monocytes Absolute 1.0  0.1 - 1.0 K/uL   Eosinophils Relative 1  0 - 5 %   Eosinophils Absolute 0.1  0.0 - 0.7 K/uL   Basophils Relative 0  0 - 1 %   Basophils Absolute 0.0  0.0 - 0.1 K/uL  COMPREHENSIVE METABOLIC PANEL      Result Value Ref  Range   Sodium 135 (*) 137 - 147 mEq/L   Potassium 3.7  3.7 - 5.3 mEq/L   Chloride 98  96 - 112 mEq/L   CO2 23  19 - 32 mEq/L   Glucose, Bld 90  70 - 99 mg/dL   BUN 5 (*) 6 - 23 mg/dL   Creatinine, Ser 7.820.98  0.50 - 1.10 mg/dL   Calcium 9.4  8.4 - 10.5 mg/dL   Total Protein 7.9  6.0 - 8.3 g/dL   Albumin 3.7  3.5 - 5.2 g/dL   AST 39 (*) 0 - 37 U/L   ALT 44 (*) 0 - 35 U/L   Alkaline Phosphatase 71  39 - 117 U/L   Total Bilirubin 0.5  0.3 - 1.2 mg/dL   GFR calc non Af Amer 81 (*) >90 mL/min   GFR calc Af Amer >90  >90 mL/min  URINE MICROSCOPIC-ADD ON      Result Value Ref Range   Squamous Epithelial / LPF RARE  RARE   WBC, UA 11-20  <3 WBC/hpf   RBC / HPF 0-2  <3 RBC/hpf     Imaging Review Ct Abdomen Pelvis Wo Contrast  11/14/2013   CLINICAL DATA:  Right flank pain.  Pyelonephritis versus stone.  EXAM: CT ABDOMEN AND PELVIS WITHOUT CONTRAST  TECHNIQUE: Multidetector CT imaging of the abdomen and pelvis was performed following the standard protocol without IV contrast.  COMPARISON:  09/16/2010.  FINDINGS: Bones: No aggressive osseous lesions. Mild S-shaped thoracolumbar scoliosis.  Lung Bases: Dependent atelectasis.  Liver: Unenhanced CT was performed per clinician order. Lack of IV contrast limits sensitivity and specificity, especially for evaluation of abdominal/pelvic solid viscera. Grossly normal.  Spleen:  Normal.  Gallbladder:  Normal.  Common bile duct:  Normal.  Pancreas:  Normal.  Adrenal glands: Suboptimal visualization due to paucity of abdominal fat. Grossly normal.  Kidneys: No renal calculi are identified. Ureters are poorly visualized due to paucity of abdominal fat. When comparing the right kidney to the left, there appears to be subtle stranding. There is also suggestion of ectasia of the right ureter. This is most commonly associated with ascending urinary tract infection. Scattered phleboliths are present along the course of the ureters in the anatomic pelvis.  Stomach:   Normal.  Small bowel:  Grossly normal.  Colon:   Normal gas-filled appendix.  Large stool burden is present.  Pelvic Genitourinary: Small amount of free fluid is present in the anatomic pelvis, potentially related to a ruptured ovarian cyst. The amount of fluid is within physiologic limits. Uterus appears within normal limits.  Vasculature: Normal.  Body Wall: Normal.  IMPRESSION: 1. Subtle perinephric stranding on the right suggests ascending urinary tract infection and pyelonephritis. 2. Suboptimal visualization of the ureters to paucity of abdominal fat. No convincing evidence of ureteral calculi. 3. Normal appendix.  Large stool burden.   Electronically Signed   By: Andreas Newport M.D.   On: 11/14/2013 22:51     EKG Interpretation None      MDM   Final diagnoses:  Pyelonephritis    Patient seen on June 11 with diagnosis of pyelonephritis. Patient treated with Cipro. Patient feels as if she hasn't gotten any better. Stool reported fevers as of yesterday. Afebrile here. Has had some nausea and vomiting but is keeping the Cipro down. Patient's urinalysis showed significant improvement here today with just 11-20 white cells. Compared to numerous to count from before. Patient first arrived tell for sure that she would require admission for failure on antibiotic. However urine culture shows Escherichia coli shows it is sensitive to Cipro. His resistant to cephalosporins. Patient was given Rocephin here. We will going give a dose of IV Cipro. And continue Cipro for an additional 7 days. Patient preferred to go home. Patient's there is marked improvement here no significant leukocytosis no renal function problems. CT scan was done just to rule out any other  possibility CT seems scan seems to be consistent with her of right-sided pyelonephritis and that's where she has her discomfort. Patient's had no vomiting here does have pain medicine at home. Patient given strict precautions that is not in proving the  next 24 hours will need to return if worse at all we'll need to return sooner. Do feel the patient does have a good chance of continued to improve at home. Takes Lopid status requires readmission but clinically she looks so well at this point in time I think a trial of additional by mouth will be fine.    Vanetta Mulders, MD 11/15/13 [phone]

## 2013-11-14 NOTE — ED Notes (Signed)
Bed: [id number] Expected date:  Expected time:  Means of arrival:  Comments: EMS- knee pain from facility

## 2013-11-14 NOTE — Discharge Instructions (Signed)
Pyelonephritis, Adult Pyelonephritis is a kidney infection. A kidney infection can happen quickly, or it can last for a long time. HOME CARE   Take your medicine (antibiotics) as told. Finish it even if you start to feel better.  Keep all doctor visits as told.  Drink enough fluids to keep your pee (urine) clear or pale yellow.  Only take medicine as told by your doctor. GET HELP RIGHT AWAY IF:   You have a fever or lasting symptoms for more than 2-3 days.  You have a fever and your symptoms suddenly get worse.  You cannot take your medicine or drink fluids as told.  You have chills and shaking.  You feel very weak or pass out (faint).  You do not feel better after 2 days. MAKE SURE YOU:  Understand these instructions.  Will watch your condition.  Will get help right away if you are not doing well or get worse. Document Released: 06/27/2004 Document Revised: 11/19/2011 Document Reviewed: 11/07/2010 [facility] Patient Information 2014 [employer] [place] [place].  Continue to take your pain medication and your Zofran for nausea. Can also add on Phenergan for nausea. Continue taking the Cipro. At least for another 7 days. Additional prescription provided. If not improving in 24 hours or for any newer worse symptoms must return. Work note provided.  Your urine culture just came back. Shows sensitivity to Cipro but resistance to Rocephin and Keflex. This is unusual but it is the case with your bone. That's the reason to continue taking Cipro.

## 2013-11-15 LAB — URINE CULTURE
Colony Count: NO GROWTH
Culture: NO GROWTH

## 2015-06-19 ENCOUNTER — Ambulatory Visit (INDEPENDENT_AMBULATORY_CARE_PROVIDER_SITE_OTHER): Payer: BLUE CROSS/BLUE SHIELD | Admitting: Podiatry

## 2015-06-19 ENCOUNTER — Encounter: Payer: Self-pay | Admitting: Podiatry

## 2015-06-19 ENCOUNTER — Ambulatory Visit (INDEPENDENT_AMBULATORY_CARE_PROVIDER_SITE_OTHER): Payer: BLUE CROSS/BLUE SHIELD

## 2015-06-19 VITALS — BP 121/86 | HR 86 | Resp 16 | Ht 64.0 in | Wt 150.0 lb

## 2015-06-19 DIAGNOSIS — M79672 Pain in left foot: Secondary | ICD-10-CM

## 2015-06-19 DIAGNOSIS — M79671 Pain in right foot: Secondary | ICD-10-CM | POA: Diagnosis not present

## 2015-06-19 DIAGNOSIS — M2040 Other hammer toe(s) (acquired), unspecified foot: Secondary | ICD-10-CM | POA: Diagnosis not present

## 2015-06-19 DIAGNOSIS — M779 Enthesopathy, unspecified: Secondary | ICD-10-CM

## 2015-06-19 MED ORDER — DICLOFENAC SODIUM 75 MG PO TBEC: 75.0000 mg | DELAYED_RELEASE_TABLET | Freq: Two times a day (BID) | ORAL | Status: DC

## 2015-06-19 NOTE — Progress Notes (Signed)
   Subjective:    Patient ID: Jorene MinorsLindsey D Heatherington, female    DOB: 01/05/1991, 25 y.o.   MRN: [id number]  HPI Patient presents with bilateral foot pain, entire bottom of feet; x7 weeks.   Review of Systems  All other systems reviewed and are negative.      Objective:   Physical Exam        Assessment & Plan:

## 2015-06-19 NOTE — Patient Instructions (Signed)

## 2015-06-21 NOTE — Progress Notes (Signed)
Subjective:     Patient ID: Bethany Small, female   DOB: Oct 03, 1990, 25 y.o.   MRN: [id number]  HPI patient presents with pain in the plantar aspects of both feet which is been present for a fairly long time and worsened recently with also knee pain and digital deformities left over right that at times can make shoe gear difficult area does not remember specific injury   Review of Systems  All other systems reviewed and are negative.      Objective:   Physical Exam  Constitutional: She is oriented to person, place, and time.  Cardiovascular: Intact distal pulses.   Musculoskeletal: Normal range of motion.  Neurological: She is oriented to person, place, and time.  Skin: Skin is warm.  Nursing note and vitals reviewed.  neurovascular status found to be intact muscle strength adequate range of motion within normal limits with moderate discomfort in the plantar arch bilateral with no specific pattern. Patient also has discomfort in the forefoot around the metatarsal phalangeal joints with only mild edema noted and does have some digital rotation of the fourth and fifth toes left over right. I did note moderate depression of the arch and patient has good digital perfusion and is well oriented 3     Assessment:     Inflammatory tendinitis bilateral with fluid buildup of a moderate nature    Plan:     H&P conditions reviewed and at this time I've recommended orthotics physical therapy and placed on oral anti-inflammatory agent to try to reduce inflammation. May require other treatments but we will try this process first and see what kind results we can get

## 2015-07-12 ENCOUNTER — Ambulatory Visit (INDEPENDENT_AMBULATORY_CARE_PROVIDER_SITE_OTHER): Payer: BLUE CROSS/BLUE SHIELD | Admitting: Podiatry

## 2015-07-12 ENCOUNTER — Encounter: Payer: Self-pay | Admitting: Podiatry

## 2015-07-12 VITALS — BP 103/59 | HR 82 | Resp 16

## 2015-07-12 DIAGNOSIS — M779 Enthesopathy, unspecified: Secondary | ICD-10-CM | POA: Diagnosis not present

## 2015-07-12 NOTE — Progress Notes (Signed)
Subjective:     Patient ID: Bethany Small, female   DOB: 1990/09/02, 25 y.o.   MRN: [id number]  HPI patient states I'm feeling a little bit better with the medicine but still having pain occasionally   Review of Systems     Objective:   Physical Exam Neurovascular status intact with patient found to have significant depression of the arch and discomfort in the heel and arch bilateral that's mild in its intensity at the current time    Assessment:     Improvement of fasciitis symptoms bilateral with medication and stretch    Plan:     Orthotics dispensed and gave instructions on physical therapy and reappoint for continued treatment as needed and dispensed orthotics with instructions today. Patient will stop the anti-inflammatory in the next 4-6 weeks

## 2015-07-12 NOTE — Progress Notes (Signed)
   Subjective:    Patient ID: Bethany Small, female    DOB: August 16, 1990, 25 y.o.   MRN: [id number]  HPI PUO on 28/17; pt stated, "Feels fine".   Review of Systems     Objective:   Physical Exam        Assessment & Plan:

## 2015-07-12 NOTE — Patient Instructions (Signed)

## 2015-09-02 ENCOUNTER — Other Ambulatory Visit: Payer: Self-pay | Admitting: Podiatry

## 2015-09-05 NOTE — Telephone Encounter (Signed)
Pt needs an appt prior to future refills. 

## 2015-11-06 ENCOUNTER — Ambulatory Visit (HOSPITAL_COMMUNITY)
Admission: RE | Admit: 2015-11-06 | Discharge: 2015-11-06 | Disposition: A | Discharge status: Home / Self Care | Payer: BLUE CROSS/BLUE SHIELD | Source: Ambulatory Visit | Attending: Family Medicine | Admitting: Family Medicine

## 2015-11-06 ENCOUNTER — Other Ambulatory Visit (HOSPITAL_COMMUNITY): Payer: Self-pay | Admitting: Family Medicine

## 2015-11-06 DIAGNOSIS — M419 Scoliosis, unspecified: Secondary | ICD-10-CM | POA: Diagnosis not present

## 2016-03-18 DIAGNOSIS — L700 Acne vulgaris: Secondary | ICD-10-CM | POA: Insufficient documentation

## 2016-03-18 DIAGNOSIS — L810 Postinflammatory hyperpigmentation: Secondary | ICD-10-CM | POA: Insufficient documentation

## 2016-03-18 DIAGNOSIS — L680 Hirsutism: Secondary | ICD-10-CM | POA: Insufficient documentation

## 2016-05-21 ENCOUNTER — Encounter (HOSPITAL_COMMUNITY): Payer: Self-pay

## 2016-05-21 ENCOUNTER — Emergency Department (HOSPITAL_COMMUNITY)
Admission: EM | Admit: 2016-05-21 | Discharge: 2016-05-22 | Disposition: A | Discharge status: Home / Self Care | Payer: BLUE CROSS/BLUE SHIELD | Attending: Emergency Medicine | Admitting: Emergency Medicine

## 2016-05-21 DIAGNOSIS — K5900 Constipation, unspecified: Secondary | ICD-10-CM | POA: Diagnosis not present

## 2016-05-21 DIAGNOSIS — R319 Hematuria, unspecified: Secondary | ICD-10-CM | POA: Diagnosis not present

## 2016-05-21 DIAGNOSIS — N390 Urinary tract infection, site not specified: Secondary | ICD-10-CM | POA: Insufficient documentation

## 2016-05-21 DIAGNOSIS — Z79899 Other long term (current) drug therapy: Secondary | ICD-10-CM | POA: Insufficient documentation

## 2016-05-21 DIAGNOSIS — R1030 Lower abdominal pain, unspecified: Secondary | ICD-10-CM | POA: Diagnosis present

## 2016-05-21 NOTE — ED Triage Notes (Signed)
Pt complains of lower abd pain and states she saw blood in her stool Pt denies vomiting but states she is nauseated

## 2016-05-22 ENCOUNTER — Emergency Department (HOSPITAL_COMMUNITY): Payer: BLUE CROSS/BLUE SHIELD

## 2016-05-22 LAB — CBC WITH DIFFERENTIAL/PLATELET
BASOS PCT: 0 %
Basophils Absolute: 0 10*3/uL (ref 0.0–0.1)
Eosinophils Absolute: 0 10*3/uL (ref 0.0–0.7)
Eosinophils Relative: 0 %
HEMATOCRIT: 39.3 % (ref 36.0–46.0)
HEMOGLOBIN: 13.5 g/dL (ref 12.0–15.0)
Lymphocytes Relative: 18 %
Lymphs Abs: 1.8 10*3/uL (ref 0.7–4.0)
MCH: 30.9 pg (ref 26.0–34.0)
MCHC: 34.4 g/dL (ref 30.0–36.0)
MCV: 89.9 fL (ref 78.0–100.0)
MONOS PCT: 4 %
Monocytes Absolute: 0.5 10*3/uL (ref 0.1–1.0)
NEUTROS ABS: 8.1 10*3/uL — AB (ref 1.7–7.7)
NEUTROS PCT: 78 %
Platelets: 250 10*3/uL (ref 150–400)
RBC: 4.37 MIL/uL (ref 3.87–5.11)
RDW: 11.9 % (ref 11.5–15.5)
WBC: 10.4 10*3/uL (ref 4.0–10.5)

## 2016-05-22 LAB — I-STAT BETA HCG BLOOD, ED (MC, WL, AP ONLY)

## 2016-05-22 LAB — BASIC METABOLIC PANEL
ANION GAP: 7 (ref 5–15)
BUN: 12 mg/dL (ref 6–20)
CHLORIDE: 106 mmol/L (ref 101–111)
CO2: 26 mmol/L (ref 22–32)
CREATININE: 0.78 mg/dL (ref 0.44–1.00)
Calcium: 9.7 mg/dL (ref 8.9–10.3)
GFR calc non Af Amer: >60 mL/min (ref >60)
Glucose, Bld: 140 mg/dL — ABNORMAL HIGH (ref 65–99)
POTASSIUM: 3.6 mmol/L (ref 3.5–5.1)
Sodium: 139 mmol/L (ref 135–145)

## 2016-05-22 LAB — URINALYSIS, ROUTINE W REFLEX MICROSCOPIC
BILIRUBIN URINE: NEGATIVE
Glucose, UA: NEGATIVE mg/dL
Ketones, ur: 5 mg/dL — AB
LEUKOCYTES UA: NEGATIVE
NITRITE: NEGATIVE
PROTEIN: NEGATIVE mg/dL
Specific Gravity, Urine: 1.009 (ref 1.005–1.030)
pH: 7 (ref 5.0–8.0)

## 2016-05-22 MED ORDER — ONDANSETRON 4 MG PO TBDP
4.0000 mg | ORAL_TABLET | Freq: Once | ORAL | Status: AC
  Administered 2016-05-22: 4 mg via ORAL
  Filled 2016-05-22: qty 1

## 2016-05-22 MED ORDER — BISACODYL 5 MG PO TBEC: 5.0000 mg | DELAYED_RELEASE_TABLET | Freq: Two times a day (BID) | ORAL | 0 refills | Status: DC

## 2016-05-22 MED ORDER — ONDANSETRON HCL 4 MG PO TABS: 4.0000 mg | ORAL_TABLET | Freq: Four times a day (QID) | ORAL | 0 refills | Status: DC

## 2016-05-22 MED ORDER — CEPHALEXIN 500 MG PO CAPS: 500.0000 mg | ORAL_CAPSULE | Freq: Two times a day (BID) | ORAL | 0 refills | Status: DC

## 2016-05-22 MED ORDER — TRAMADOL HCL 50 MG PO TABS: 50.0000 mg | ORAL_TABLET | Freq: Four times a day (QID) | ORAL | 0 refills | Status: DC | PRN

## 2016-05-22 MED ORDER — OXYCODONE-ACETAMINOPHEN 5-325 MG PO TABS
1.0000 | ORAL_TABLET | Freq: Once | ORAL | Status: AC
  Administered 2016-05-22: 1 via ORAL
  Filled 2016-05-22: qty 1

## 2016-05-22 NOTE — ED Provider Notes (Signed)
[facility]-EMERGENCY DEPT Provider Note   CSN: [id number] Arrival date & time: 05/21/16  2348  By signing my name below, I, Soijett Blue, attest that this documentation has been prepared under the direction and in the presence of Marlon Pel, PA-C Electronically Signed: Soijett Blue, ED Scribe. 05/22/16. 1:46 AM.  History   Chief Complaint Chief Complaint  Patient presents with  . Abdominal Pain    HPI Bethany Small is a 24 y.o. female who presents to the Emergency Department complaining of lower abdominal pain onset yesterday. She is having associated symptoms of constipation, she saw a small amount of blood with wiping. Pt hasn't had past episodes of blood in stool prior to her recent symptoms. She has not tried any medications for the relief of her symptoms. She denies dysuria, vaginal bleeding, vaginal discharge, rectal pain, fever, and any other symptoms.    The history is provided by the patient. No language interpreter was used.    History reviewed. No pertinent past medical history.  There are no active problems to display for this patient.   History reviewed. No pertinent surgical history.  OB History    No data available      Home Medications    Prior to Admission medications   Medication Sig Start Date End Date Taking? Authorizing Provider  norethindrone-ethinyl estradiol (JUNEL 1/20) 1-20 MG-MCG tablet Take 1 tablet by mouth daily. 03/07/16  Yes Historical Provider, MD  bisacodyl (DULCOLAX) 5 MG EC tablet Take 1 tablet (5 mg total) by mouth 2 (two) times daily. 05/22/16   Terrisa Curfman Neva Seat, PA-C  cephALEXin (KEFLEX) 500 MG capsule Take 1 capsule (500 mg total) by mouth 2 (two) times daily. 05/22/16   Griselda Bramblett Neva Seat, PA-C  diclofenac (VOLTAREN) 75 MG EC tablet TAKE 1 TABLET BY MOUTH TWICE A DAY Patient not taking: Reported on 05/22/2016 09/05/15   Kirstie Peri Regal, DPM  ondansetron (ZOFRAN) 4 MG tablet Take 1 tablet (4 mg total) by mouth every 6 (six) hours. 05/22/16    Marlon Pel, PA-C  traMADol (ULTRAM) 50 MG tablet Take 1 tablet (50 mg total) by mouth every 6 (six) hours as needed. 05/22/16   Marlon Pel, PA-C   Family History Family History  Problem Relation Age of Onset  . Hypertension Other    Social History Social History  Substance Use Topics  . Smoking status: Never Smoker  . Smokeless tobacco: Never Used  . Alcohol use No     Allergies   Doxycycline   Review of Systems Review of Systems  Constitutional: Negative for fever.  Gastrointestinal: Positive for abdominal pain (lower), blood in stool, constipation and nausea. Negative for rectal pain and vomiting.  Genitourinary: Negative for dysuria, vaginal bleeding and vaginal discharge.  All other systems reviewed and are negative.   Physical Exam Updated Vital Signs BP 126/73 (BP Location: Left Arm)   Pulse 68   Temp 98.2 F (36.8 C) (Oral)   Resp 18   Ht 5\' 4"  (1.626 m)   Wt 68 kg   LMP 05/08/2016 Comment: neg preg test  SpO2 99%   BMI 25.75 kg/m   Physical Exam  Constitutional: She is oriented to person, place, and time. She appears well-developed and well-nourished. No distress.  HENT:  Head: Normocephalic and atraumatic.  Eyes: EOM are normal.  Neck: Neck supple.  Cardiovascular: Normal rate.   Pulmonary/Chest: Effort normal. No respiratory distress.  Abdominal: Soft. Bowel sounds are normal. She exhibits no distension. There is tenderness in the suprapubic area.  There is no rigidity, no rebound and no guarding.  Suprapubic pain on palpation without guarding, rigidity, or rebound. Abdomen soft without distention. Nl bowel sounds.   Musculoskeletal: Normal range of motion.  Neurological: She is alert and oriented to person, place, and time.  Skin: Skin is warm and dry.  Psychiatric: She has a normal mood and affect. Her behavior is normal.  Nursing note and vitals reviewed.   ED Treatments / Results  DIAGNOSTIC STUDIES: Oxygen Saturation is 100% on RA,  nl by my interpretation.    COORDINATION OF CARE: 1:35 AM Discussed treatment plan with pt at bedside which includes labs, UA, CT abdomen pelvis, and pt agreed to plan.   Labs (all labs ordered are listed, but only abnormal results are displayed) Labs Reviewed  URINE CULTURE - Abnormal; Notable for the following:       Result Value   Culture   (*)    Value: <10,000 COLONIES/mL INSIGNIFICANT GROWTH Performed [facility]    All other components within normal limits  CBC WITH DIFFERENTIAL/PLATELET - Abnormal; Notable for the following:    Neutro Abs 8.1 (*)    All other components within normal limits  BASIC METABOLIC PANEL - Abnormal; Notable for the following:    Glucose, Bld 140 (*)    All other components within normal limits  URINALYSIS, ROUTINE W REFLEX MICROSCOPIC - Abnormal; Notable for the following:    Color, Urine STRAW (*)    Hgb urine dipstick MODERATE (*)    Ketones, ur 5 (*)    Bacteria, UA MANY (*)    Squamous Epithelial / LPF 0-5 (*)    All other components within normal limits  I-STAT BETA HCG BLOOD, ED ([facility], [facility], [facility] ONLY)    Radiology No results found.  Procedures Procedures (including critical care time)  Medications Ordered in ED Medications  oxyCODONE-acetaminophen (PERCOCET/ROXICET) 5-325 MG per tablet 1 tablet (1 tablet Oral Given 05/22/16 0300)  ondansetron (ZOFRAN-ODT) disintegrating tablet 4 mg (4 mg Oral Given 05/22/16 0300)     Initial Impression / Assessment and Plan / ED Course  I have reviewed the triage vital signs and the nursing notes.  Pertinent labs & imaging results that were available during my care of the patient were reviewed by me and considered in my medical decision making (see chart for details).  Clinical Course     Pt has moderate amount of stool on xray. She admits to straining and saw a small amount of blood with wiping.  Pt will give Dulcolax, Keflex - for concern for UTI, culture sent out. Pt well  appearing, tolerating PO, abdomen is without guarding or rigidity.  I discussed results, diagnoses and plan with Bethany Small. They voice there understanding and questions were answered. We discussed follow-up recommendations and return precautions.    Final Clinical Impressions(s) / ED Diagnoses   Final diagnoses:  Constipation  Constipation, unspecified constipation type  Urinary tract infection with hematuria, site unspecified    New Prescriptions Discharge Medication List as of 05/22/2016  3:30 AM    START taking these medications   Details  bisacodyl (DULCOLAX) 5 MG EC tablet Take 1 tablet (5 mg total) by mouth 2 (two) times daily., Starting Wed 05/22/2016, Print    cephALEXin (KEFLEX) 500 MG capsule Take 1 capsule (500 mg total) by mouth 2 (two) times daily., Starting Wed 05/22/2016, Print    ondansetron (ZOFRAN) 4 MG tablet Take 1 tablet (4 mg total) by mouth every 6 (  six) hours., Starting Wed 05/22/2016, Print    traMADol (ULTRAM) 50 MG tablet Take 1 tablet (50 mg total) by mouth every 6 (six) hours as needed., Starting Wed 05/22/2016, Print       I personally performed the services described in this documentation, which was scribed in my presence. The recorded information has been reviewed and is accurate.     Marlon Peliffany Riann Oman, PA-C 05/26/16 2032    Zadie Rhineonald Wickline, MD 05/27/16 [id number]

## 2016-05-22 NOTE — ED Notes (Signed)
Patient transported to X-ray 

## 2016-05-23 LAB — URINE CULTURE

## 2017-03-25 ENCOUNTER — Other Ambulatory Visit (HOSPITAL_COMMUNITY): Payer: Self-pay | Admitting: Family Medicine

## 2017-03-25 ENCOUNTER — Ambulatory Visit (HOSPITAL_COMMUNITY)
Admission: RE | Admit: 2017-03-25 | Discharge: 2017-03-25 | Disposition: A | Discharge status: Home / Self Care | Payer: BLUE CROSS/BLUE SHIELD | Source: Ambulatory Visit | Attending: Family Medicine | Admitting: Family Medicine

## 2017-03-25 DIAGNOSIS — R1903 Right lower quadrant abdominal swelling, mass and lump: Secondary | ICD-10-CM | POA: Diagnosis present

## 2017-03-25 DIAGNOSIS — R1031 Right lower quadrant pain: Secondary | ICD-10-CM

## 2017-03-25 MED ORDER — IOPAMIDOL (ISOVUE-300) INJECTION 61%
100.0000 mL | Freq: Once | INTRAVENOUS | Status: AC | PRN
  Administered 2017-03-25: 100 mL via INTRAVENOUS

## 2017-03-25 MED ORDER — IOPAMIDOL (ISOVUE-300) INJECTION 61%
INTRAVENOUS | Status: AC
  Filled 2017-03-25: qty 30

## 2017-04-08 ENCOUNTER — Encounter (INDEPENDENT_AMBULATORY_CARE_PROVIDER_SITE_OTHER): Payer: Self-pay

## 2017-04-08 ENCOUNTER — Encounter (INDEPENDENT_AMBULATORY_CARE_PROVIDER_SITE_OTHER): Payer: Self-pay | Admitting: Internal Medicine

## 2017-04-21 ENCOUNTER — Ambulatory Visit (INDEPENDENT_AMBULATORY_CARE_PROVIDER_SITE_OTHER): Payer: BLUE CROSS/BLUE SHIELD | Admitting: Internal Medicine

## 2017-04-30 ENCOUNTER — Ambulatory Visit (INDEPENDENT_AMBULATORY_CARE_PROVIDER_SITE_OTHER): Payer: BLUE CROSS/BLUE SHIELD | Admitting: Internal Medicine

## 2017-04-30 ENCOUNTER — Encounter (INDEPENDENT_AMBULATORY_CARE_PROVIDER_SITE_OTHER): Payer: Self-pay | Admitting: Internal Medicine

## 2017-06-13 ENCOUNTER — Emergency Department (HOSPITAL_BASED_OUTPATIENT_CLINIC_OR_DEPARTMENT_OTHER): Payer: BLUE CROSS/BLUE SHIELD

## 2017-06-13 ENCOUNTER — Other Ambulatory Visit: Payer: Self-pay

## 2017-06-13 ENCOUNTER — Emergency Department (HOSPITAL_BASED_OUTPATIENT_CLINIC_OR_DEPARTMENT_OTHER)
Admission: EM | Admit: 2017-06-13 | Discharge: 2017-06-13 | Disposition: A | Discharge status: Home / Self Care | Payer: BLUE CROSS/BLUE SHIELD | Attending: Emergency Medicine | Admitting: Emergency Medicine

## 2017-06-13 ENCOUNTER — Encounter (HOSPITAL_BASED_OUTPATIENT_CLINIC_OR_DEPARTMENT_OTHER): Payer: Self-pay | Admitting: *Deleted

## 2017-06-13 DIAGNOSIS — R519 Headache, unspecified: Secondary | ICD-10-CM

## 2017-06-13 DIAGNOSIS — R51 Headache: Secondary | ICD-10-CM | POA: Insufficient documentation

## 2017-06-13 DIAGNOSIS — Z79899 Other long term (current) drug therapy: Secondary | ICD-10-CM | POA: Diagnosis not present

## 2017-06-13 MED ORDER — KETOROLAC TROMETHAMINE 30 MG/ML IJ SOLN
15.0000 mg | Freq: Once | INTRAMUSCULAR | Status: AC
  Administered 2017-06-13: 15 mg via INTRAVENOUS
  Filled 2017-06-13: qty 1

## 2017-06-13 MED ORDER — PROMETHAZINE HCL 25 MG/ML IJ SOLN
12.5000 mg | Freq: Once | INTRAMUSCULAR | Status: AC
  Administered 2017-06-13: 12.5 mg via INTRAVENOUS
  Filled 2017-06-13: qty 1

## 2017-06-13 MED ORDER — SODIUM CHLORIDE 0.9 % IV BOLUS (SEPSIS)
500.0000 mL | Freq: Once | INTRAVENOUS | Status: AC
  Administered 2017-06-13: 500 mL via INTRAVENOUS

## 2017-06-13 NOTE — ED Triage Notes (Signed)
Headache and nausea for a week. [facility] was [facility] and given Zofran. She was told to come here for further evaluation of headache.

## 2017-06-13 NOTE — ED Notes (Signed)
Advil and Excedrin but has no relief.  Head has been aching for a week.

## 2017-06-13 NOTE — ED Provider Notes (Signed)
[facility] EMERGENCY DEPARTMENT Provider Note   CSN: [id number] Arrival date & time: 06/13/17  1219     History   Chief Complaint Chief Complaint  Patient presents with  . Headache    HPI Bethany Small is a 27 y.o. female.  HPI Patient with headache over the last week.  Started as a "normal headache".  Was on the right side of her head now both sides of the head but more severe in the right.  Both dull and throbbing.  Has had some nausea and vomiting.  No fevers.  No vision changes.  No trauma.  No ringing in her ears.  Does not typically get many headaches.  Denies possibly of pregnancy and was seen in urgent care prior to arrival here and had a negative pregnancy test.  Also clean urine.  Denies trauma.  No relief with Motrin or Tylenol at home.Mild photophobia History reviewed. No pertinent past medical history.  There are no active problems to display for this patient.   History reviewed. No pertinent surgical history.  OB History    No data available       Home Medications    Prior to Admission medications   Medication Sig Start Date End Date Taking? Authorizing Provider  norethindrone-ethinyl estradiol (JUNEL 1/20) 1-20 MG-MCG tablet Take 1 tablet by mouth daily. 03/07/16  Yes [provider]  ondansetron (ZOFRAN) 4 MG tablet Take 1 tablet (4 mg total) by mouth every 6 (six) hours. 05/22/16  Yes Neva SeatGreene, Tiffany, PA-C  bisacodyl (DULCOLAX) 5 MG EC tablet Take 1 tablet (5 mg total) by mouth 2 (two) times daily. 05/22/16   Marlon PelGreene, Tiffany, PA-C  cephALEXin (KEFLEX) 500 MG capsule Take 1 capsule (500 mg total) by mouth 2 (two) times daily. 05/22/16   Marlon PelGreene, Tiffany, PA-C  diclofenac (VOLTAREN) 75 MG EC tablet TAKE 1 TABLET BY MOUTH TWICE A DAY Patient not taking: Reported on 05/22/2016 09/05/15   Lenn Sinkegal, Norman S, DPM  traMADol (ULTRAM) 50 MG tablet Take 1 tablet (50 mg total) by mouth every 6 (six) hours as needed. 05/22/16   Marlon PelGreene, Tiffany, PA-C     Family History Family History  Problem Relation Age of Onset  . Hypertension Other     Social History Social History   Tobacco Use  . Smoking status: Never Smoker  . Smokeless tobacco: Never Used  Substance Use Topics  . Alcohol use: No  . Drug use: No     Allergies   Doxycycline   Review of Systems Review of Systems  Constitutional: Negative for activity change.  HENT: Negative for congestion.   Respiratory: Negative for shortness of breath.   Cardiovascular: Negative for chest pain.  Gastrointestinal: Negative for abdominal pain.  Genitourinary: Negative for dysuria.  Musculoskeletal: Negative for back pain and neck pain.  Skin: Negative for rash.  Neurological: Positive for headaches.  Hematological: Negative for adenopathy.  Psychiatric/Behavioral: Negative for confusion.     Physical Exam Updated Vital Signs BP (!) 138/92 (BP Location: Left Arm)   Pulse 72   Temp 98.4 F (36.9 C) (Oral)   Resp 16   Ht 5\' 4"  (1.626 m)   Wt 63.5 kg (140 lb)   LMP 06/01/2017   SpO2 100%   BMI 24.03 kg/m   Physical Exam  Constitutional: She appears well-developed.  HENT:  Head: Normocephalic and atraumatic.  Eyes: EOM are normal.  Neck: Neck supple.  Cardiovascular: Normal rate.  Abdominal: Soft.  Musculoskeletal: She exhibits no edema.  Neurological: She is alert. She has normal strength.  Skin: Skin is warm.  Psychiatric: She has a normal mood and affect.     ED Treatments / Results  Labs (all labs ordered are listed, but only abnormal results are displayed) Labs Reviewed - No data to display  EKG  EKG Interpretation None       Radiology Ct Head Wo Contrast  Result Date: 06/13/2017 CLINICAL DATA:  Acute headaches.  Normal neuro exam. EXAM: CT HEAD WITHOUT CONTRAST TECHNIQUE: Contiguous axial images were obtained from the base of the skull through the vertex without intravenous contrast. COMPARISON:  None. FINDINGS: Brain: No evidence of acute  infarction, hemorrhage, hydrocephalus, extra-axial collection or mass lesion/mass effect. Vascular: No hyperdense vessel or unexpected calcification. Skull: Normal. Negative for fracture or focal lesion. Sinuses/Orbits: No acute finding. Other: None. IMPRESSION: Normal head CT. Electronically Signed   By: Sherian Rein M.D.   On: 06/13/2017 16:04    Procedures Procedures (including critical care time)  Medications Ordered in ED Medications  sodium chloride 0.9 % bolus 500 mL (500 mLs Intravenous New Bag/Given 06/13/17 1531)  ketorolac (TORADOL) 30 MG/ML injection 15 mg (15 mg Intravenous Given 06/13/17 1534)  promethazine (PHENERGAN) injection 12.5 mg (12.5 mg Intravenous Given 06/13/17 1536)     Initial Impression / Assessment and Plan / ED Course  I have reviewed the triage vital signs and the nursing notes.  Pertinent labs & imaging results that were available during my care of the patient were reviewed by me and considered in my medical decision making (see chart for details).     Patient presents with headache.  Has had for the last week.  Feels better after migraine cocktail and negative head CT.  Will discharge home.  No real red flags for the headache.  Final Clinical Impressions(s) / ED Diagnoses   Final diagnoses:  Acute nonintractable headache, unspecified headache type    ED Discharge Orders    None      Benjiman Core, MD 06/13/17 [id number]

## 2017-07-17 ENCOUNTER — Encounter: Payer: Self-pay | Admitting: Podiatry

## 2017-07-17 ENCOUNTER — Ambulatory Visit (INDEPENDENT_AMBULATORY_CARE_PROVIDER_SITE_OTHER): Payer: BLUE CROSS/BLUE SHIELD

## 2017-07-17 ENCOUNTER — Ambulatory Visit: Payer: BLUE CROSS/BLUE SHIELD | Admitting: Podiatry

## 2017-07-17 DIAGNOSIS — L84 Corns and callosities: Secondary | ICD-10-CM

## 2017-07-17 DIAGNOSIS — M2041 Other hammer toe(s) (acquired), right foot: Secondary | ICD-10-CM | POA: Diagnosis not present

## 2017-07-17 NOTE — Patient Instructions (Signed)
Pre-Operative Instructions  Congratulations, you have decided to take an important step towards improving your quality of life.  You can be assured that the doctors and staff at [facility] will be with you every step of the way.  Here are some important things you should know:  1. Plan to be at the surgery center/hospital at least 1 (one) hour prior to your scheduled time, unless otherwise directed by the surgical center/hospital staff.  You must have a responsible adult accompany you, remain during the surgery and drive you home.  Make sure you have directions to the surgical center/hospital to ensure you arrive on time. 2. If you are having surgery at [facility] or [facility], you will need a copy of your medical history and physical form from your family physician within one month prior to the date of surgery. We will give you a form for your primary physician to complete.  3. We make every effort to accommodate the date you request for surgery.  However, there are times where surgery dates or times have to be moved.  We will contact you as soon as possible if a change in schedule is required.   4. No aspirin/ibuprofen for one week before surgery.  If you are on aspirin, any non-steroidal anti-inflammatory medications (Mobic, Aleve, Ibuprofen) should not be taken seven (7) days prior to your surgery.  You make take Tylenol for pain prior to surgery.  5. Medications - If you are taking daily heart and blood pressure medications, seizure, reflux, allergy, asthma, anxiety, pain or diabetes medications, make sure you notify the surgery center/hospital before the day of surgery so they can tell you which medications you should take or avoid the day of surgery. 6. No food or drink after midnight the night before surgery unless directed otherwise by surgical center/hospital staff. 7. No alcoholic beverages 24-hours prior to surgery.  No smoking 24-hours prior or 24-hours after  surgery. 8. Wear loose pants or shorts. They should be loose enough to fit over bandages, boots, and casts. 9. Don't wear slip-on shoes. Sneakers are preferred. 10. Bring your boot with you to the surgery center/hospital.  Also bring crutches or a walker if your physician has prescribed it for you.  If you do not have this equipment, it will be provided for you after surgery. 11. If you have not been contacted by the surgery center/hospital by the day before your surgery, call to confirm the date and time of your surgery. 12. Leave-time from work may vary depending on the type of surgery you have.  Appropriate arrangements should be made prior to surgery with your employer. 13. Prescriptions will be provided immediately following surgery by your doctor.  Fill these as soon as possible after surgery and take the medication as directed. Pain medications will not be refilled on weekends and must be approved by the doctor. 14. Remove nail polish on the operative foot and avoid getting pedicures prior to surgery. 15. Wash the night before surgery.  The night before surgery wash the foot and leg well with water and the antibacterial soap provided. Be sure to pay special attention to beneath the toenails and in between the toes.  Wash for at least three (3) minutes. Rinse thoroughly with water and dry well with a towel.  Perform this wash unless told not to do so by your physician.  Enclosed: 1 Ice pack (please put in freezer the night before surgery)   1 Hibiclens skin cleaner     Pre-op instructions  If you have any questions regarding the instructions, please do not hesitate to call our office.  [place]: [address], [place], [place] [address] -- [phone]  [place]: [address] [place], [place] [address] -- [phone]  [place]: [address]  , [place] [address] -- [phone]  [place]: [address], [address], [place], [place] [address] -- [phone]  Website:  [email or web address] 

## 2017-07-17 NOTE — Progress Notes (Signed)
Subjective:   Patient ID: Bethany Small, female   DOB: 27 y.o.   MRN: [id number]   HPI Patient presents with a 1 year history of chronic corn formation fourth digit right that is very tender and makes shoe gear difficult.  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  I do think that this is a structural deformity and I have recommended hammertoe repair due to the length of time she has it.  I did discuss the consideration of trimming padding but she states she does that herself and is not been effective.  I discussed distal arthroplasty and I allowed her to read a consent form going over the surgery itself and alternative treatments complications.  Patient understands no guarantee and a total recovery.  Can take 6 months to 1 year and the toe may be chronically swollen.  Patient wants surgery signed consent form and is scheduled for outpatient surgery  X-ray indicates there is enlargement of the middle phalanx digit 4 right with rotation of the toe

## 2017-08-07 ENCOUNTER — Telehealth: Payer: Self-pay | Admitting: *Deleted

## 2017-08-07 NOTE — Telephone Encounter (Signed)
"  I have a surgical procedure on the 12th.  I need to cancel that."  I attempted to return her call.  I left her a message to call me back to reschedule her surgery.  I called and left a message for Aram BeechamCynthia at [facility] to cancel patient's surgery scheduled for March 12 with Dr. Charlsie Merlesegal.  The reason is unknown.

## 2017-08-07 NOTE — Telephone Encounter (Signed)
I will cancel pt's post-op appointments.

## 2017-08-20 ENCOUNTER — Other Ambulatory Visit: Payer: BLUE CROSS/BLUE SHIELD

## 2017-12-15 ENCOUNTER — Telehealth: Payer: Self-pay | Admitting: *Deleted

## 2017-12-15 NOTE — Telephone Encounter (Signed)
"  I'm a patient of Dr. Charlsie Merlesegal.  I was supposed to have scheduled surgery.  I'd like to schedule that now.  When can he do it in August?"  He can do it August 13 without you coming in to see Dr. Charlsie Merlesegal but if you do it after August 14, you will need an appointment for a consult with Dr. Charlsie Merlesegal prior to having surgery.  "Okay, I'll check with my job and see if I can do it on August 13.  I'll give you a call back."

## 2017-12-16 ENCOUNTER — Telehealth: Payer: Self-pay | Admitting: *Deleted

## 2017-12-16 NOTE — Telephone Encounter (Signed)
"  I was needing to reschedule my surgery.  Hopefully I can get it scheduled for August 13.  If you would, just give me a call back."  I left her a message that I will get her scheduled for surgery on 01/13/2018.  I told her that someone from the surgical center would call her the Friday or Monday before the surgery date and they will give her the arrival time.  I asked her to call if she has any questions.

## 2017-12-23 IMAGING — CR DG ABDOMEN 2V
2 series · 2 of 2 positions shown · non-contrast
Comparison: Prior CT from 11/14/2013.

CLINICAL DATA: Initial evaluation for acute mid abdominal pain,
nausea, vomiting, constipation.

EXAM:
ABDOMEN - 2 VIEW

[w abdomen upright]
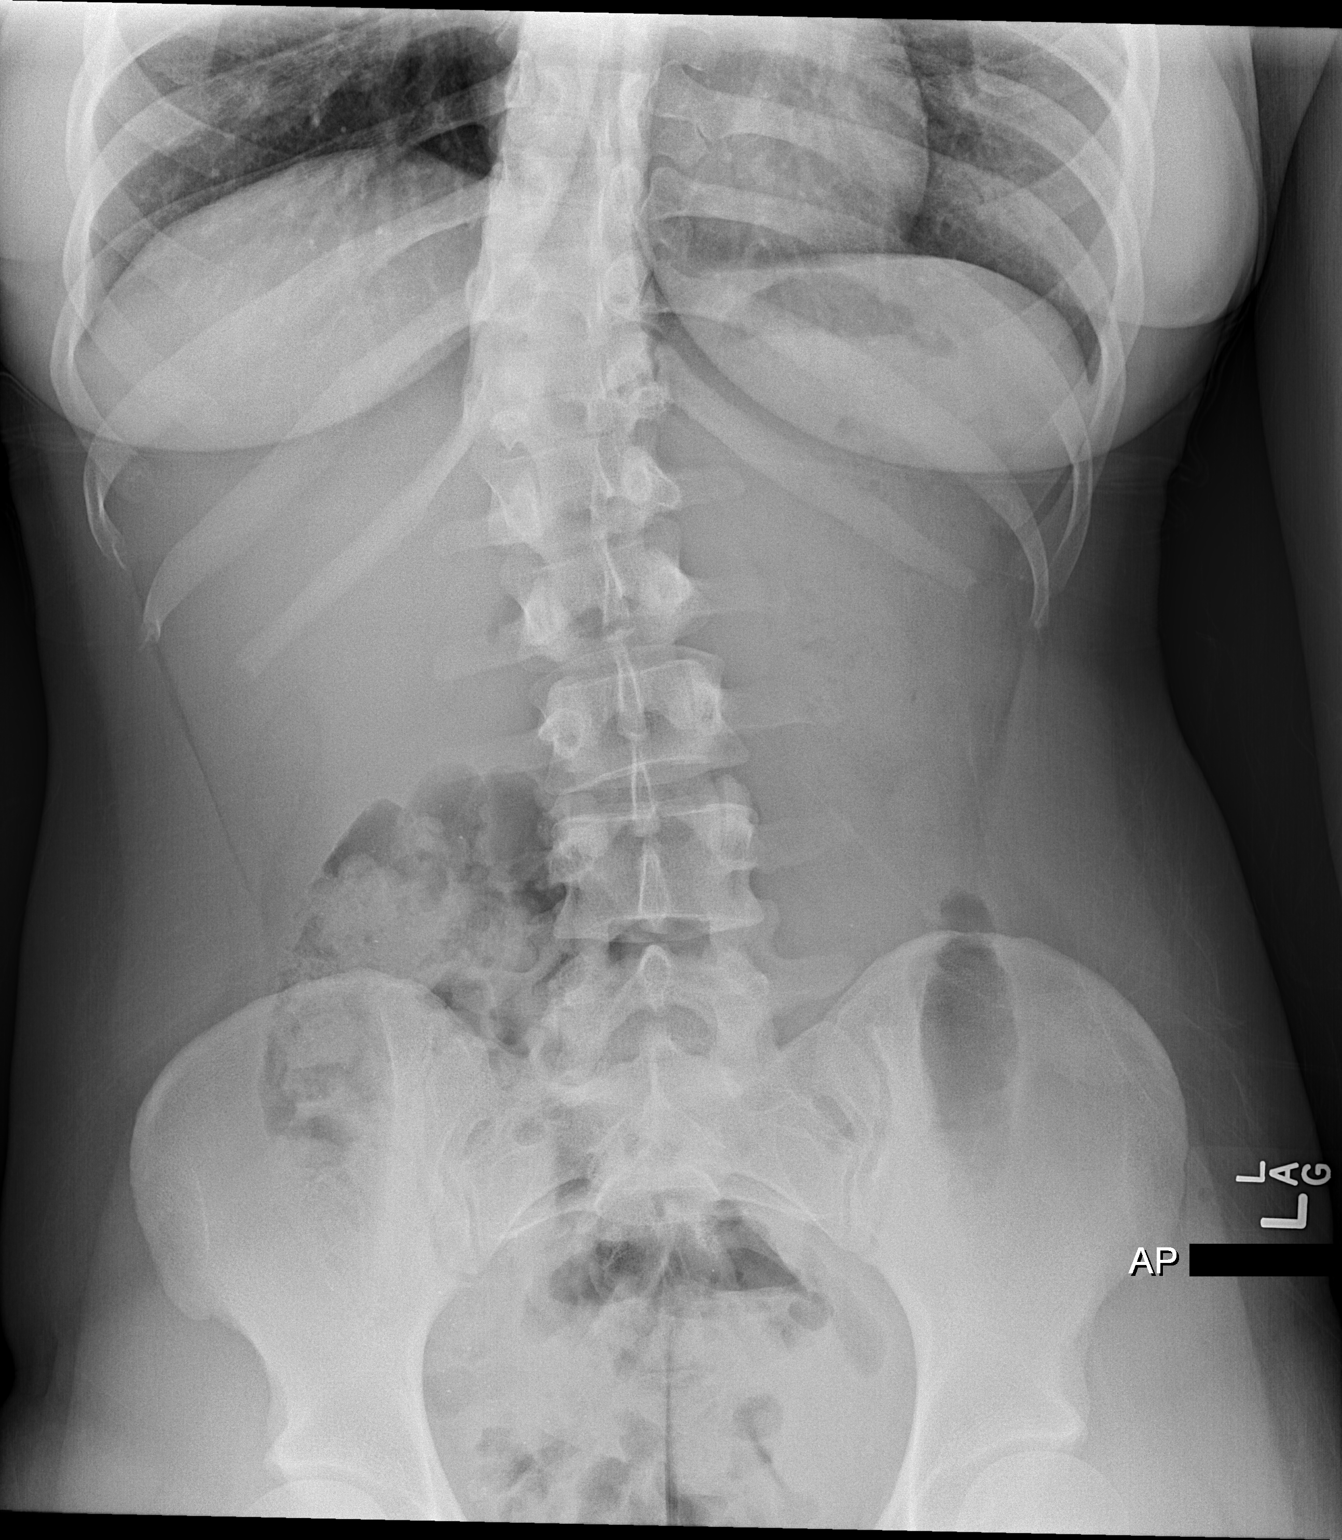

[t abdomen supine]
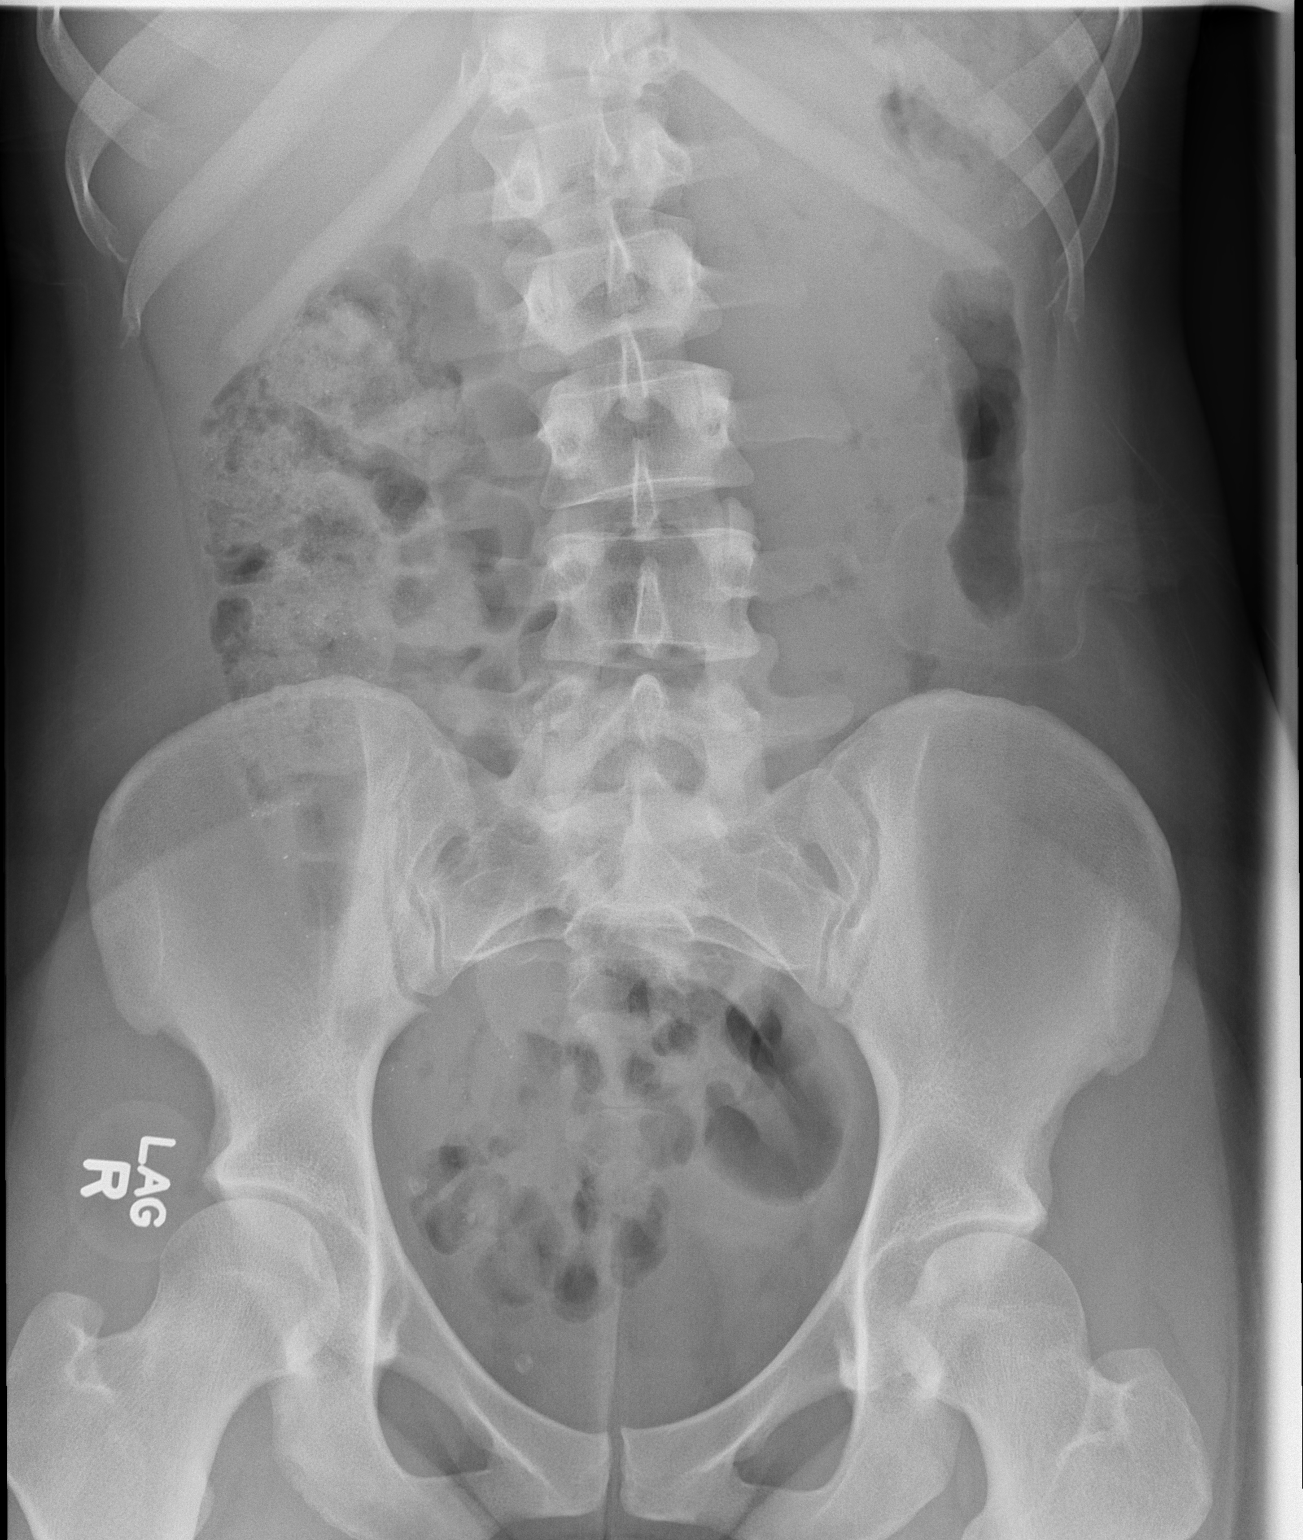

[2 of 2 positions shown; findings below may reference images not displayed]

FINDINGS: Paucity of gas limits evaluation of the bowels. No evidence for
obstruction or ileus. No abnormal bowel wall thickening. No free
air. Moderate amount of retained stool within the right colon. No
soft tissue mass or abnormal calcification. Few scattered punctate
densities overlying the colon within the right abdomen suspected to
lie within the fecal stream, suspected to reflect sequelae of prior
contrast administration.

Scoliosis noted.  No acute osseous abnormality.

Visualized lung bases are clear.
IMPRESSION: 1. Nonobstructive bowel gas pattern with no radiographic evidence
for acute intra- abdominal process.
2. Moderate amount of retained stool within the ascending colon.

## 2018-01-06 ENCOUNTER — Telehealth: Payer: Self-pay | Admitting: Podiatry

## 2018-01-06 NOTE — Telephone Encounter (Signed)
Called pt and left a voicemail in regards to her insurance. I asked if she has the same [employer] insurance or if it has changed. I told her I was helping the surgical coordinator who is out this week with getting insurance authorizations. Asked pt to call me back at [phone] and to leave a message with the information if I did not answer.

## 2018-01-07 ENCOUNTER — Telehealth: Payer: Self-pay | Admitting: Podiatry

## 2018-01-07 NOTE — Telephone Encounter (Signed)
Called and left another voicemail asking pt to call us back in regards to her insurance as she is scheduled to have surgery this coming Tuesday, 13 August with Dr. Charlsie Merlesegal. Asked her to call me back at either [phone] or at [phone].

## 2018-01-08 ENCOUNTER — Telehealth: Payer: Self-pay | Admitting: Podiatry

## 2018-01-08 NOTE — Telephone Encounter (Signed)
Attempted to return pt's in regards to message she left earlier. Asked pt to call me back at [phone].

## 2018-01-08 NOTE — Telephone Encounter (Signed)
I'm scheduled to have surgery on Tuesday 13 August. I was wondering if the time is available for me to be there as well as the insurance information, when that needs to be given. If you would call me back at [phone]. Thank you.

## 2018-01-09 ENCOUNTER — Telehealth: Payer: Self-pay | Admitting: Podiatry

## 2018-01-09 NOTE — Telephone Encounter (Signed)
I received a message from Shanda BumpsJessica about getting my updated insurance information. I was calling to give that to her before I get into work. Please call me back at [phone].

## 2018-01-09 NOTE — Telephone Encounter (Signed)
I called pt back and left her a message that I had just stepped away from my desk when she called and I hate that we keep playing phone tag. Told her to call me back directly at [phone] and that if I'm on another line or not at my desk to leave her insurance information on my voicemail as I'm the only one that can retrieve the messages. I told her to leave the name of the insurance, member/subscriber ID, group number, and the customer service/provider line numbers off the back of the card.

## 2018-01-09 NOTE — Telephone Encounter (Signed)
I called Bethany Small back and told her someone from the surgical center would be calling her today or Monday to let her know what time to arrive. I told her it depends on their schedule and if there are any changes to their schedule, or if there are any diabetic pt's or kids that need to have surgery first.

## 2018-01-09 NOTE — Telephone Encounter (Signed)
Pt called back and left her new insurance information. Also wanted to know what time her surgery will be.

## 2018-01-13 ENCOUNTER — Other Ambulatory Visit: Payer: Self-pay

## 2018-01-13 ENCOUNTER — Telehealth: Payer: Self-pay | Admitting: Podiatry

## 2018-01-13 ENCOUNTER — Encounter: Payer: Self-pay | Admitting: Podiatry

## 2018-01-13 DIAGNOSIS — M2041 Other hammer toe(s) (acquired), right foot: Secondary | ICD-10-CM | POA: Diagnosis not present

## 2018-01-13 MED ORDER — ONDANSETRON HCL 4 MG PO TABS: 4.0000 mg | ORAL_TABLET | Freq: Three times a day (TID) | ORAL | 0 refills | Status: DC | PRN

## 2018-01-13 NOTE — Telephone Encounter (Signed)
I had surgery with Dr. Charlsie Merlesegal this morning and he gave me a pain pill but I'm having some nausea. Is there anyway Dr. Charlsie Merlesegal can call me in something for nausea? I use the [facility] on [address]. My phone number is [phone]. Thank you.

## 2018-01-21 ENCOUNTER — Ambulatory Visit (INDEPENDENT_AMBULATORY_CARE_PROVIDER_SITE_OTHER): Payer: BLUE CROSS/BLUE SHIELD | Admitting: Podiatry

## 2018-01-21 ENCOUNTER — Encounter: Payer: Self-pay | Admitting: Podiatry

## 2018-01-21 ENCOUNTER — Ambulatory Visit (INDEPENDENT_AMBULATORY_CARE_PROVIDER_SITE_OTHER): Payer: BLUE CROSS/BLUE SHIELD

## 2018-01-21 VITALS — BP 120/77 | Temp 96.6°F

## 2018-01-21 DIAGNOSIS — M2041 Other hammer toe(s) (acquired), right foot: Secondary | ICD-10-CM

## 2018-01-21 NOTE — Progress Notes (Signed)
Subjective:   Patient ID: Bethany MinorsLindsey D Small, female   DOB: 27 y.o.   MRN: [id number]   HPI Patient states doing very well with her surgery with minimal discomfort noted   ROS      Objective:  Physical Exam  Neurovascular status intact with patient's fourth toe right healing well with wound edges well coapted stitches in place     Assessment:  Doing well post arthroplasty digit 4 right     Plan:  X-ray reviewed and reapplied sterile dressing and instructed on continued open toed shoes and reduced activity and reappoint 2 weeks stitch removal or earlier if needed  X-ray indicates adequate amount of bone resected fourth digit right with excellent clinical position

## 2018-02-04 ENCOUNTER — Ambulatory Visit (INDEPENDENT_AMBULATORY_CARE_PROVIDER_SITE_OTHER): Payer: BLUE CROSS/BLUE SHIELD | Admitting: Podiatry

## 2018-02-04 ENCOUNTER — Ambulatory Visit (INDEPENDENT_AMBULATORY_CARE_PROVIDER_SITE_OTHER): Payer: BLUE CROSS/BLUE SHIELD

## 2018-02-04 DIAGNOSIS — M2041 Other hammer toe(s) (acquired), right foot: Secondary | ICD-10-CM | POA: Diagnosis not present

## 2018-02-04 NOTE — Progress Notes (Signed)
Subjective:   Patient ID: Bethany Small, female   DOB: 27 y.o.   MRN: [id number]   HPI Patient presents stating her right foot is doing real well and she wants to get the left one fixed and is can look for a date to get that done.  States that she is walking well with minimal discomfort   ROS      Objective:  Physical Exam  Neurovascular status intact with patient's fourth toe healing well wound edges well coapted digit in good alignment with stitches in place     Assessment:  Doing well post arthroplasty distal fourth toe right     Plan:  H&P x-ray right reviewed stitches removed wound edges well coapted.  I went ahead today advised on gradual return to soft shoes we then discussed her left foot which she wants fixed.  I allowed her to read consent form for correction of left fourth toe explaining all possible complications and the fact there is no long-term guarantees as far as surgical intervention and she is willing to accept risk signed consent form after extensive review.  Patient is scheduled for outpatient surgery at the current time and is encouraged to call with any questions prior to procedure  X-ray right indicates satisfactory resection of head of middle phalanx digit for right

## 2018-02-04 NOTE — Patient Instructions (Signed)
Pre-Operative Instructions  Congratulations, you have decided to take an important step towards improving your quality of life.  You can be assured that the doctors and staff at [facility] will be with you every step of the way.  Here are some important things you should know:  1. Plan to be at the surgery center/hospital at least 1 (one) hour prior to your scheduled time, unless otherwise directed by the surgical center/hospital staff.  You must have a responsible adult accompany you, remain during the surgery and drive you home.  Make sure you have directions to the surgical center/hospital to ensure you arrive on time. 2. If you are having surgery at [facility] or [facility] hospitals, you will need a copy of your medical history and physical form from your family physician within one month prior to the date of surgery. We will give you a form for your primary physician to complete.  3. We make every effort to accommodate the date you request for surgery.  However, there are times where surgery dates or times have to be moved.  We will contact you as soon as possible if a change in schedule is required.   4. No aspirin/ibuprofen for one week before surgery.  If you are on aspirin, any non-steroidal anti-inflammatory medications (Mobic, Aleve, Ibuprofen) should not be taken seven (7) days prior to your surgery.  You make take Tylenol for pain prior to surgery.  5. Medications - If you are taking daily heart and blood pressure medications, seizure, reflux, allergy, asthma, anxiety, pain or diabetes medications, make sure you notify the surgery center/hospital before the day of surgery so they can tell you which medications you should take or avoid the day of surgery. 6. No food or drink after midnight the night before surgery unless directed otherwise by surgical center/hospital staff. 7. No alcoholic beverages 24-hours prior to surgery.  No smoking 24-hours prior or 24-hours after  surgery. 8. Wear loose pants or shorts. They should be loose enough to fit over bandages, boots, and casts. 9. Don't wear slip-on shoes. Sneakers are preferred. 10. Bring your boot with you to the surgery center/hospital.  Also bring crutches or a walker if your physician has prescribed it for you.  If you do not have this equipment, it will be provided for you after surgery. 11. If you have not been contacted by the surgery center/hospital by the day before your surgery, call to confirm the date and time of your surgery. 12. Leave-time from work may vary depending on the type of surgery you have.  Appropriate arrangements should be made prior to surgery with your employer. 13. Prescriptions will be provided immediately following surgery by your doctor.  Fill these as soon as possible after surgery and take the medication as directed. Pain medications will not be refilled on weekends and must be approved by the doctor. 14. Remove nail polish on the operative foot and avoid getting pedicures prior to surgery. 15. Wash the night before surgery.  The night before surgery wash the foot and leg well with water and the antibacterial soap provided. Be sure to pay special attention to beneath the toenails and in between the toes.  Wash for at least three (3) minutes. Rinse thoroughly with water and dry well with a towel.  Perform this wash unless told not to do so by your physician.  Enclosed: 1 Ice pack (please put in freezer the night before surgery)   1 Hibiclens skin cleaner     Pre-op instructions  If you have any questions regarding the instructions, please do not hesitate to call our office.  [place]: [address], [place], [place] [address] -- [phone]  [place]: [address] [place], [place] [address] -- [phone]  [place]: [address]  [place], [place] [address] -- [phone]  [place]: [address], [address], [place], [place] [address] -- [phone]  Website:  [email or web address] 

## 2018-02-05 ENCOUNTER — Telehealth: Payer: Self-pay | Admitting: *Deleted

## 2018-02-05 NOTE — Telephone Encounter (Signed)
"  I'd like to schedule my surgery with Dr. Charlsie Merles.  Is it too early to schedule anything in December?"  It's not too early.  Do you have a date in mind?  He does surgery on Tuesdays.  "Is December 17 available?"  Yes, it's available.  I will get your surgery scheduled.  Someone from the surgical center will call you the Friday or Monday before your surgery date and they will give you your arrival time.

## 2018-05-19 ENCOUNTER — Encounter: Payer: Self-pay | Admitting: Podiatry

## 2018-05-19 DIAGNOSIS — M2042 Other hammer toe(s) (acquired), left foot: Secondary | ICD-10-CM | POA: Diagnosis not present

## 2018-05-22 ENCOUNTER — Telehealth: Payer: Self-pay | Admitting: Podiatry

## 2018-05-22 MED ORDER — ONDANSETRON HCL 4 MG PO TABS: 4.0000 mg | ORAL_TABLET | Freq: Three times a day (TID) | ORAL | 0 refills | Status: DC | PRN

## 2018-05-22 NOTE — Telephone Encounter (Signed)
Pt had surgery on 12/17 and was prescribed pain medication but is experiencing some nausea and wanted to know if the doctor could prescribe something to help. Please give pt a call back.

## 2018-05-22 NOTE — Telephone Encounter (Signed)
Left message informing Dr. Charlsie Merlesegal had refilled the Zofran.

## 2018-05-22 NOTE — Addendum Note (Signed)
Addended by: Alphia Kava'CONNELL, VALERY D on: 05/22/2018 01:31 PM   Modules accepted: Orders

## 2018-05-25 ENCOUNTER — Encounter: Payer: Self-pay | Admitting: Podiatry

## 2018-05-25 ENCOUNTER — Ambulatory Visit (INDEPENDENT_AMBULATORY_CARE_PROVIDER_SITE_OTHER): Payer: BLUE CROSS/BLUE SHIELD

## 2018-05-25 ENCOUNTER — Ambulatory Visit (INDEPENDENT_AMBULATORY_CARE_PROVIDER_SITE_OTHER): Payer: BLUE CROSS/BLUE SHIELD | Admitting: Podiatry

## 2018-05-25 VITALS — BP 115/73 | HR 91

## 2018-05-25 DIAGNOSIS — B373 Candidiasis of vulva and vagina: Secondary | ICD-10-CM | POA: Insufficient documentation

## 2018-05-25 DIAGNOSIS — N390 Urinary tract infection, site not specified: Secondary | ICD-10-CM | POA: Insufficient documentation

## 2018-05-25 DIAGNOSIS — M2041 Other hammer toe(s) (acquired), right foot: Secondary | ICD-10-CM | POA: Diagnosis not present

## 2018-05-25 DIAGNOSIS — B3731 Acute candidiasis of vulva and vagina: Secondary | ICD-10-CM | POA: Insufficient documentation

## 2018-05-26 NOTE — Progress Notes (Signed)
Subjective:   Patient ID: Bethany MinorsLindsey D Gebbia, female   DOB: 27 y.o.   MRN: [id number]   HPI Patient states doing very well with the toe stating that she is having minimal discomfort and that she has walked without pain   ROS      Objective:  Physical Exam  Neurovascular status intact negative Homans sign noted with patient's fourth digit left healing well wound edges well coapted and in good alignment     Assessment:  Doing well post arthroplasty fourth toe left     Plan:  Reviewed x-ray sterile dressing reapplied continue with compression elevation immobilization and reappoint to recheck again as needed in the next few weeks for suture removal  X-rays indicate satisfactory resection of bone with good alignment noted

## 2018-06-08 ENCOUNTER — Ambulatory Visit (INDEPENDENT_AMBULATORY_CARE_PROVIDER_SITE_OTHER): Payer: BLUE CROSS/BLUE SHIELD | Admitting: Podiatry

## 2018-06-08 ENCOUNTER — Ambulatory Visit (INDEPENDENT_AMBULATORY_CARE_PROVIDER_SITE_OTHER): Payer: BLUE CROSS/BLUE SHIELD

## 2018-06-08 ENCOUNTER — Encounter: Payer: BLUE CROSS/BLUE SHIELD | Admitting: Podiatry

## 2018-06-08 DIAGNOSIS — M2042 Other hammer toe(s) (acquired), left foot: Secondary | ICD-10-CM | POA: Diagnosis not present

## 2018-06-10 NOTE — Progress Notes (Signed)
Subjective:   Patient ID: Bethany Small, female   DOB: 28 y.o.   MRN: [id number]   HPI Patient states doing real well with mild swelling but overall feeling good   ROS      Objective:  Physical Exam  Neurovascular status intact with patient's fourth digit left healing well wound edges well coapted good alignment of the digit     Assessment:  Overall doing well post arthroplasty digit for left     Plan:  H&P condition reviewed and at this point stitches removed wound edges coapted well.  I went ahead and reapplied a small dressing to the area advised to continued open toed shoes with gradual increase in activity and should return to normal shoes within the next several weeks and will be seen back as needed  X-rays indicate that the bone has been removed satisfactory head of the middle phalanx left foot

## 2019-01-13 ENCOUNTER — Other Ambulatory Visit: Payer: Self-pay

## 2019-01-13 DIAGNOSIS — Z20822 Contact with and (suspected) exposure to covid-19: Secondary | ICD-10-CM

## 2019-01-14 LAB — NOVEL CORONAVIRUS, NAA: SARS-CoV-2, NAA: NOT DETECTED

## 2019-01-14 IMAGING — CT CT HEAD W/O CM
3 series · 14 of 47 positions shown, 16 images · non-contrast
Comparison: None.

CLINICAL DATA: Acute headaches.  Normal neuro exam.

EXAM:
CT HEAD WITHOUT CONTRAST
TECHNIQUE: Contiguous axial images were obtained from the base of the skull
through the vertex without intravenous contrast.

[Series 2: head wo · axial · 0.39mm/px · z∈[-161,-36]mm · 8 of 30 slices shown, 10 images]
[im 3/30  brain]
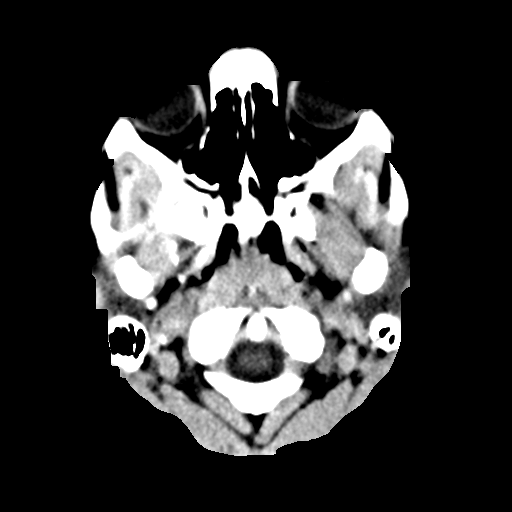
[im 3/30  bone]
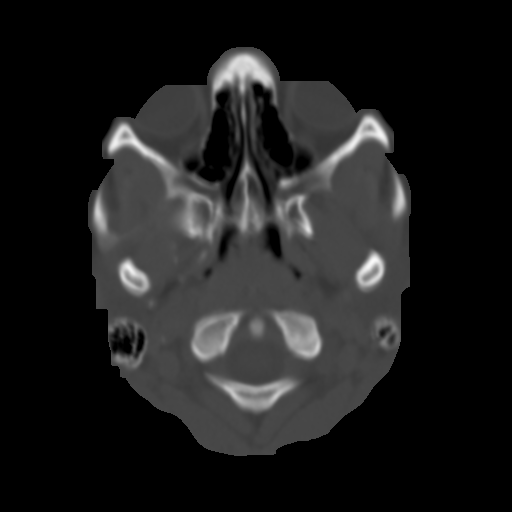
[im 7/30  brain]
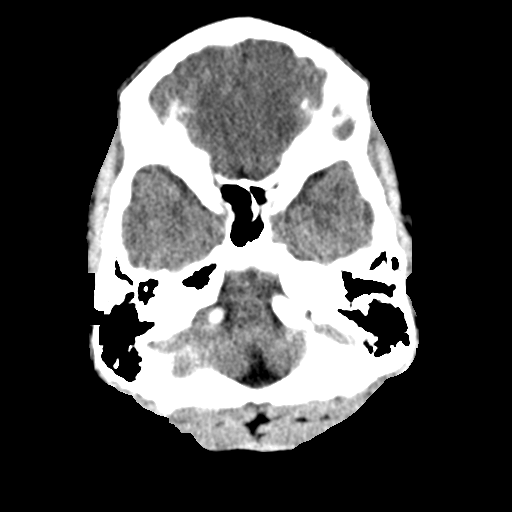
[im 10/30  brain]
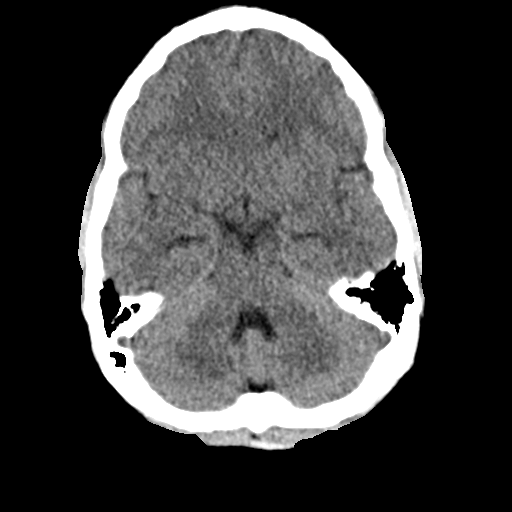
[im 14/30  brain]
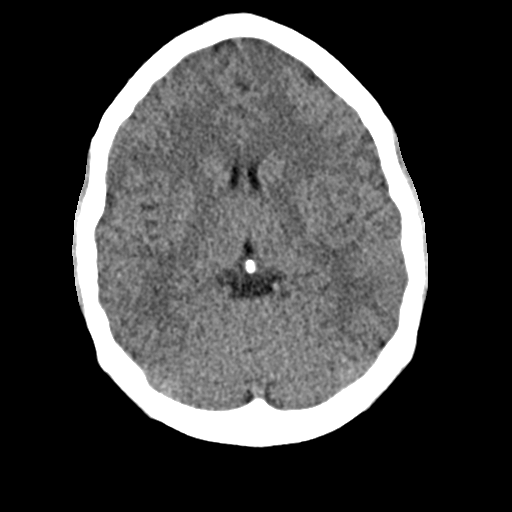
[im 17/30  brain]
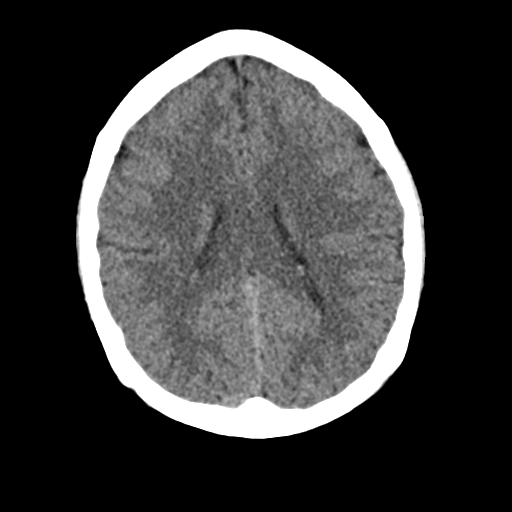
[im 17/30  bone]
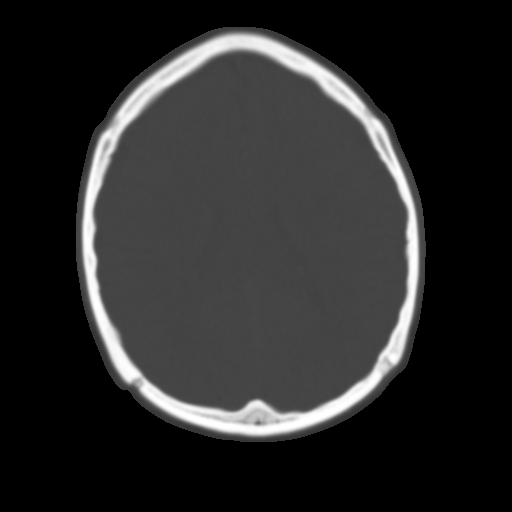
[im 21/30  brain]
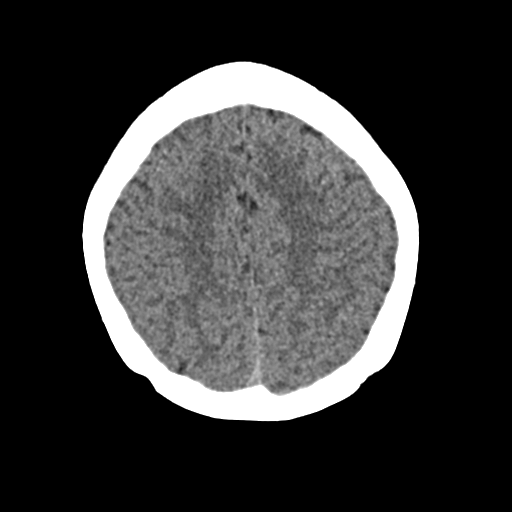
[im 24/30  brain]
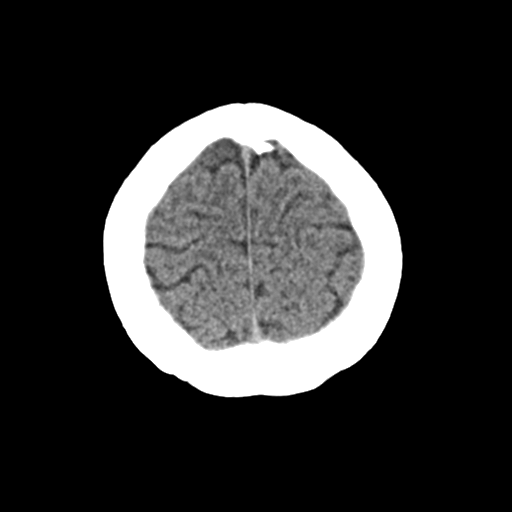
[im 28/30  brain]
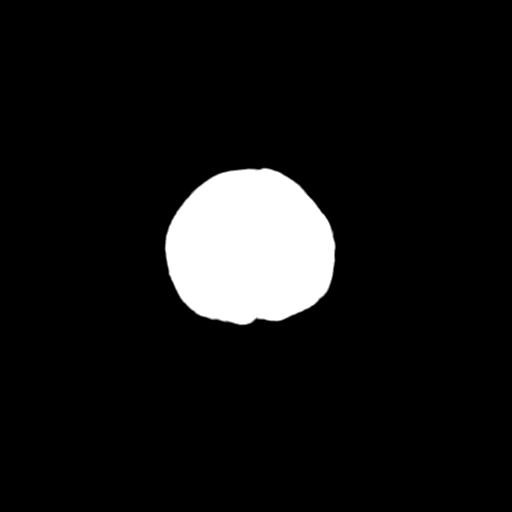

[Series 4: coronal soft · coronal · 0.28mm/px · 3 of 60 slices shown]
[im 20/60  brain]
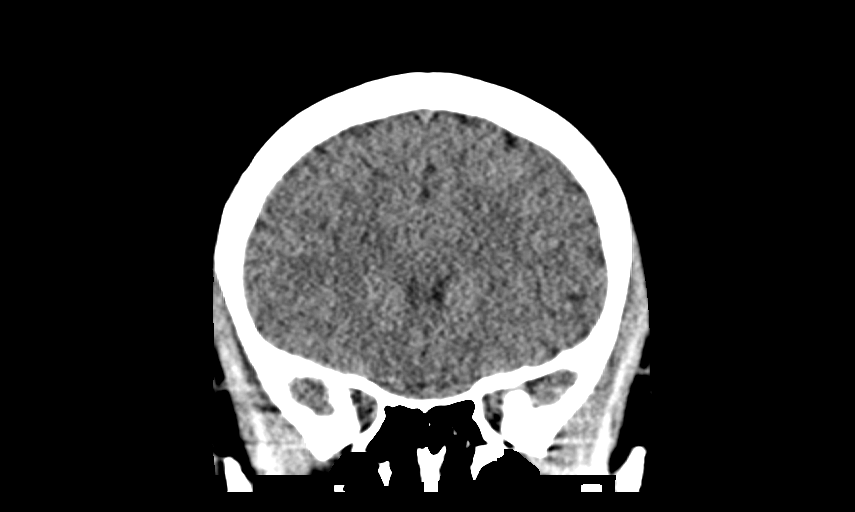
[im 27/60  brain]
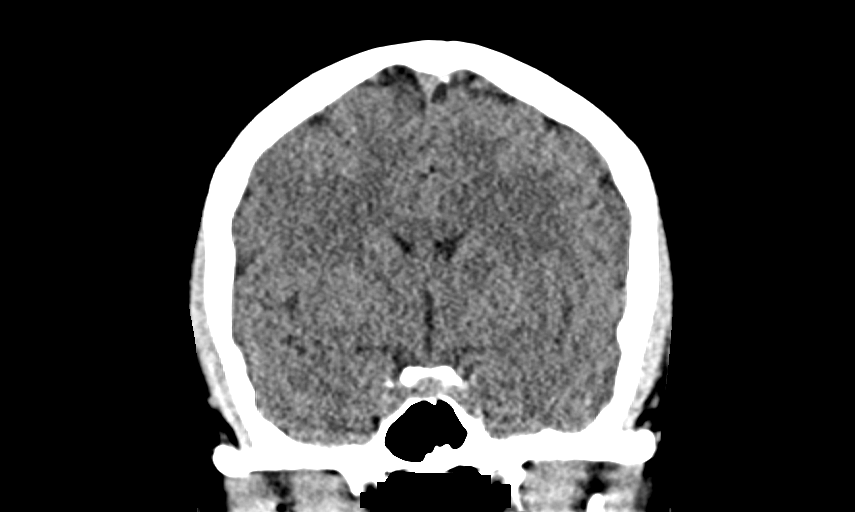
[im 33/60  brain]
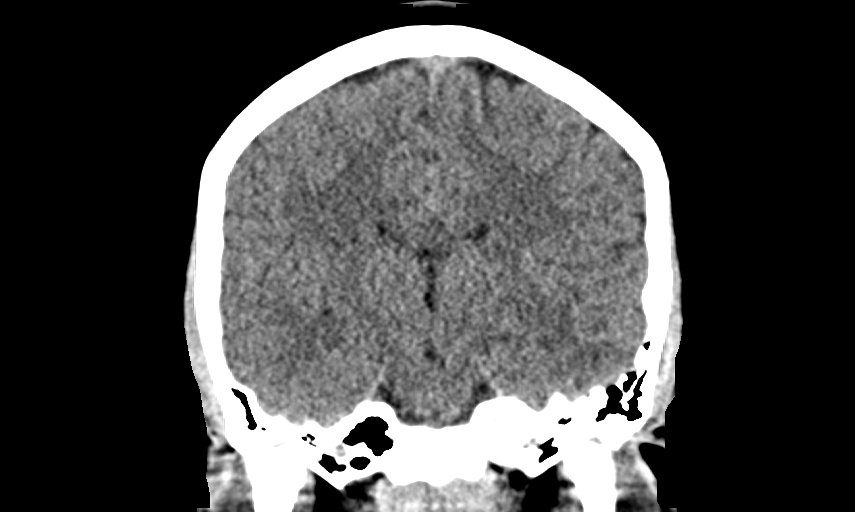

[Series 5: sag soft · sagittal · 0.28mm/px · 3 of 55 slices shown]
[im 19/55  brain]
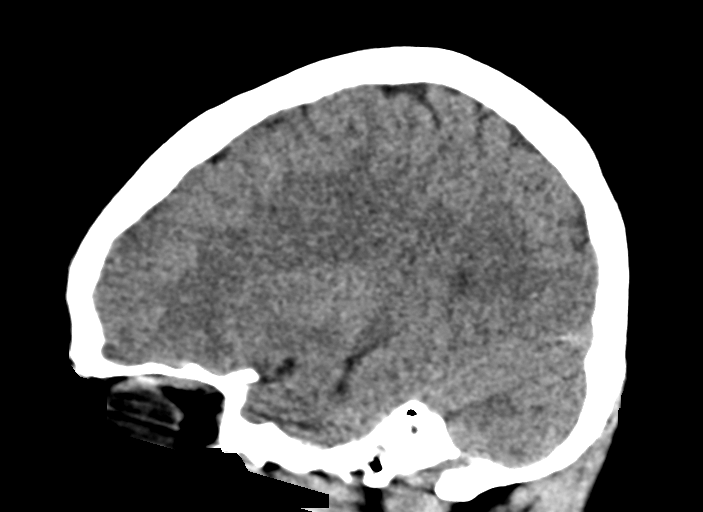
[im 28/55  brain]
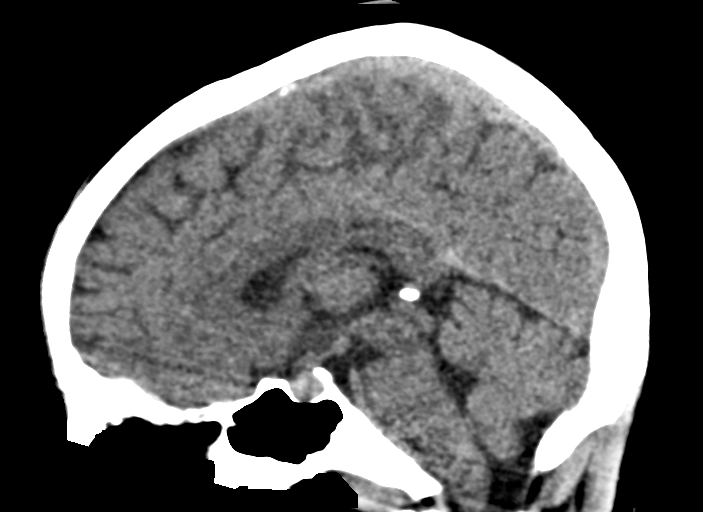
[im 37/55  brain]
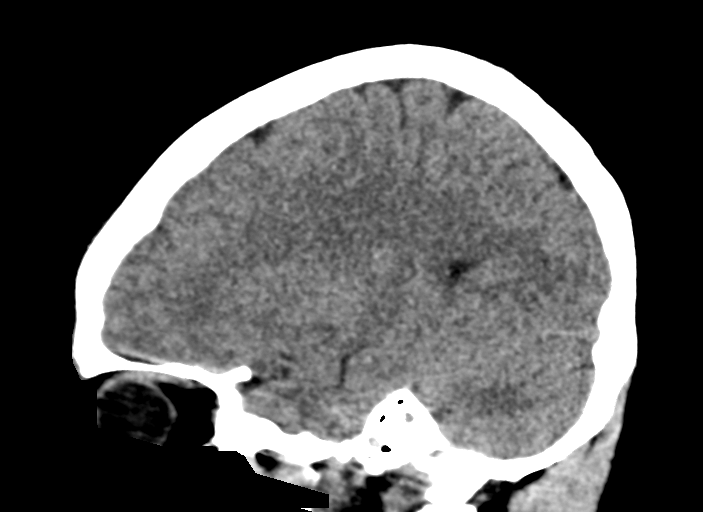

[14 of 47 positions shown; findings below may reference images not displayed]

FINDINGS: Brain: No evidence of acute infarction, hemorrhage, hydrocephalus,
extra-axial collection or mass lesion/mass effect.

Vascular: No hyperdense vessel or unexpected calcification.

Skull: Normal. Negative for fracture or focal lesion.

Sinuses/Orbits: No acute finding.

Other: None.
IMPRESSION: Normal head CT.

## 2019-04-16 ENCOUNTER — Other Ambulatory Visit (HOSPITAL_COMMUNITY): Payer: Self-pay | Admitting: Family Medicine

## 2019-04-16 DIAGNOSIS — Z23 Encounter for immunization: Secondary | ICD-10-CM

## 2019-11-27 ENCOUNTER — Other Ambulatory Visit: Payer: Self-pay

## 2019-11-27 ENCOUNTER — Emergency Department (HOSPITAL_COMMUNITY)
Admission: EM | Admit: 2019-11-27 | Discharge: 2019-11-27 | Disposition: A | Discharge status: Home / Self Care | Payer: BLUE CROSS/BLUE SHIELD | Attending: Emergency Medicine | Admitting: Emergency Medicine

## 2019-11-27 DIAGNOSIS — H9203 Otalgia, bilateral: Secondary | ICD-10-CM | POA: Diagnosis not present

## 2019-11-27 DIAGNOSIS — H6983 Other specified disorders of Eustachian tube, bilateral: Secondary | ICD-10-CM

## 2019-11-27 DIAGNOSIS — J3489 Other specified disorders of nose and nasal sinuses: Secondary | ICD-10-CM | POA: Diagnosis not present

## 2019-11-27 DIAGNOSIS — H6993 Unspecified Eustachian tube disorder, bilateral: Secondary | ICD-10-CM | POA: Insufficient documentation

## 2019-11-27 DIAGNOSIS — H9209 Otalgia, unspecified ear: Secondary | ICD-10-CM | POA: Diagnosis present

## 2019-11-27 NOTE — ED Notes (Signed)
Pt verbalizes understanding of DC instructions. Pt belongings returned and is ambulatory out of ED.  

## 2019-11-27 NOTE — ED Notes (Signed)
Incorrect data validated for vitals

## 2019-11-27 NOTE — Discharge Instructions (Signed)
You have been seen here for ear pain.  I recommend that you continue to take Flonase and over-the-counter Claritin as this will help with drainage and opening up your eustachian tube.  I want you to alternate between taking ibuprofen and Tylenol every 6 hours for pain.  For example you can take Tylenol wait 6 hours then take ibuprofen wait another 6 hours and then repeat.  Please follow dosages on the back of the bottles.  I would not take antibiotics unless symptoms persist for another week at that time I would go see your primary care doctor for further evaluation.  Please see your primary care doctor in 1 week's time if symptoms persist.  I want you to come back to the emergency department if you develop severe chest pain, shortness of breath, uncontrolled nausea, vomiting, diarrhea as these symptoms require further evaluation management.

## 2019-11-27 NOTE — ED Triage Notes (Signed)
Patient reports she has not been felling well for two weeks. Patient says she was told it was a cold and then told it was a sinus infection. Patient says she has taken steroids, flonase and zyrtec and has not felt better.

## 2019-11-27 NOTE — ED Provider Notes (Signed)
[facility] DEPT Provider Note   CSN: [id number] Arrival date & time: 11/27/19  1124     History Chief Complaint  Patient presents with  . Otalgia  . sinus pressure    Bethany Small is a 29 y.o. female.  HPI   Patient presents to the emergency department with chief complaint of bilateral ear pain that is being on for last 3 weeks.  Patient states she was seen by 3 providers for same complaint.  She states she was given a course of antibiotics which she had finished as well as given instructions to take Flonase and Sudafed's.  after finishing her antibiotics as well as continue with Flonase and Sudafed she still has continued ear pain.  She describes the pain as a consistent dull throbbing pain with no alleviating or aggravating factors.  She denies fever, chills, sore throat, nasal drip, cough or shortness of breath.  She also complains of feeling like her heart has been beating faster but denies chest pain or shortness of breath, nausea, vomiting or becoming diaphoretic.  Patient has been seen in the past for eustachian tube dysfunction after a upper URI, she has no other symptoms medical history.  No past medical history on file.  Patient Active Problem List   Diagnosis Date Noted  . Candidal vulvovaginitis 05/25/2018  . Urinary tract infectious disease 05/25/2018  . Acne vulgaris 03/18/2016  . Hirsutism 03/18/2016  . Post-inflammatory hyperpigmentation 03/18/2016    No past surgical history on file.   OB History   No obstetric history on file.     Family History  Problem Relation Age of Onset  . Hypertension Other     Social History   Tobacco Use  . Smoking status: Never Smoker  . Smokeless tobacco: Never Used  Substance Use Topics  . Alcohol use: No  . Drug use: No    Home Medications Prior to Admission medications   Medication Sig Start Date End Date Taking? Authorizing Provider  bisacodyl (DULCOLAX) 5 MG EC tablet Take 1  tablet (5 mg total) by mouth 2 (two) times daily. 05/22/16   Delos Haring, PA-C  cephALEXin (KEFLEX) 500 MG capsule Take 1 capsule (500 mg total) by mouth 2 (two) times daily. 05/22/16   Delos Haring, PA-C  diclofenac (VOLTAREN) 75 MG EC tablet TAKE 1 TABLET BY MOUTH TWICE A DAY 09/05/15   Wallene Huh, DPM  norethindrone-ethinyl estradiol (JUNEL 1/20) 1-20 MG-MCG tablet Take 1 tablet by mouth daily. 03/07/16   [provider]  ondansetron (ZOFRAN) 4 MG tablet Take 1 tablet (4 mg total) by mouth every 6 (six) hours. 05/22/16   Delos Haring, PA-C  ondansetron (ZOFRAN) 4 MG tablet Take 1 tablet (4 mg total) by mouth every 8 (eight) hours as needed for nausea or vomiting. 05/22/18   Regal, Tamala Fothergill, DPM  traMADol (ULTRAM) 50 MG tablet Take 1 tablet (50 mg total) by mouth every 6 (six) hours as needed. 05/22/16   Delos Haring, PA-C    Allergies    Doxycycline  Review of Systems   Review of Systems  Constitutional: Negative for chills and fever.  HENT: Positive for ear pain. Negative for congestion, dental problem, drooling, facial swelling, hearing loss, mouth sores, nosebleeds, postnasal drip, rhinorrhea, sinus pressure, sinus pain and sore throat.   Eyes: Negative for pain and redness.  Respiratory: Negative for cough and shortness of breath.   Cardiovascular: Negative for chest pain.  Gastrointestinal: Negative for abdominal pain, diarrhea, nausea  and vomiting.  Genitourinary: Negative for enuresis, flank pain and frequency.  Musculoskeletal: Negative for back pain and joint swelling.  Skin: Negative for rash.  Neurological: Negative for dizziness, light-headedness and headaches.  Hematological: Does not bruise/bleed easily.    Physical Exam Updated Vital Signs BP 130/79   Pulse 99   Temp 98.3 F (36.8 C) (Oral)   Resp 18   Ht 5\' 4"  (1.626 m)   Wt 74.8 kg   LMP 11/20/2019   SpO2 99%   BMI 28.32 kg/m   Physical Exam Vitals and nursing note reviewed.    Constitutional:      General: She is not in acute distress.    Appearance: She is not ill-appearing.  HENT:     Head: Normocephalic and atraumatic.     Comments: Ears were visualized,  Ear canal patent, no erythema or swelling noted, TMs fully intact, pearly white, nonbulging nonerythematous no fluid noted.  TMs did look retracted bilaterally.    Right Ear: Tympanic membrane, ear canal and external ear normal. There is no impacted cerumen.     Left Ear: Tympanic membrane, ear canal and external ear normal. There is no impacted cerumen.     Nose: No congestion.     Mouth/Throat:     Mouth: Mucous membranes are moist.     Pharynx: Oropharynx is clear. No oropharyngeal exudate or posterior oropharyngeal erythema.     Comments: Mouth was visualized, no caries noted on teeth, gums nonerythematous, no gingivitis noted no abscess seen.  Uvula was midline, tongue was midline. Eyes:     General: No scleral icterus.    Conjunctiva/sclera: Conjunctivae normal.     Pupils: Pupils are equal, round, and reactive to light.  Cardiovascular:     Rate and Rhythm: Normal rate and regular rhythm.     Pulses: Normal pulses.     Heart sounds: No murmur heard.  No friction rub. No gallop.   Pulmonary:     Effort: No respiratory distress.     Breath sounds: No wheezing, rhonchi or rales.  Abdominal:     General: There is no distension.     Palpations: Abdomen is soft.     Tenderness: There is no abdominal tenderness. There is no guarding.  Musculoskeletal:        General: No swelling.  Skin:    General: Skin is warm and dry.     Capillary Refill: Capillary refill takes less than 2 seconds.     Findings: No rash.  Neurological:     Mental Status: She is alert and oriented to person, place, and time.  Psychiatric:        Mood and Affect: Mood normal.     ED Results / Procedures / Treatments   Labs (all labs ordered are listed, but only abnormal results are displayed) Labs Reviewed - No data to  display  EKG EKG Interpretation  Date/Time:  Saturday November 27 2019 11:59:30 EDT Ventricular Rate:  121 PR Interval:    QRS Duration: 67 QT Interval:  324 QTC Calculation: 460 R Axis:   52 Text Interpretation: Sinus tachycardia Repol abnrm suggests ischemia, inferior leads 12 Lead; Mason-Likar No old tracing to compare Confirmed by [birth year]) on 11/27/2019 2:08:46 PM   Radiology No results found.  Procedures Procedures (including critical care time)  Medications Ordered in ED Medications - No data to display  ED Course  I have reviewed the triage vital signs and the nursing notes.  Pertinent labs &  imaging results that were available during my care of the patient were reviewed by me and considered in my medical decision making (see chart for details).    MDM Rules/Calculators/A&P                          I have personally reviewed all imaging, labs and have interpreted them.  Due to patient's complaint most concern for acute otitis media, external otitis media, eustachian tube dysfunction.  Unlikely patient suffering from otitis media or externa as physical exam of ear was benign, there is no erythema noted in the ear canal, no erythema noted on the TM, nonbulging, pearly white but had retracted TMs typical of eustachian tube dysfunction. will defer labs and further imaging as Patient vitals are reassuring, patient was afebrile, nontachycardic, nontachypneic, patient was nontoxic-appearing.  Patient was nontachycardic, denies chest pain or shortness of breath, patient has low cardiac risk factors, physical exam did not show pedal edema, signs of hypoperfusion no indication for further cardiac work-up.  Patient appears to be resting comfortably in bed, showing no acute signs distress.  Vital signs are reassuring does not meet criteria to be admitted to the hospital.  Likely patient is suffering from eustachian tube dysfunction secondary to upper respiratory infection.  I  do not recommend further antibiotics at this time as she is already completed a course of antibiotics and there are no signs of infection noted on exam. She should continue with Flonase and Claritin for further drainage and decrease inflammation of eustachian tubes.  Patient will be recommended to follow-up with primary doctor in a week's time for further evaluation.  Patient was discussed with attending who agrees with assessment and plan.  Patient was given at home instructions while strict return precautions.  Patient verbalized that she understood and agrees with said plan.  Final Clinical Impression(s) / ED Diagnoses Final diagnoses:  Dysfunction of both eustachian tubes  Ear pain, bilateral    Rx / DC Orders ED Discharge Orders    None       Carroll Sage, PA-C 11/27/19 1433    Mancel Bale, MD 11/27/19 2038

## 2019-12-13 ENCOUNTER — Ambulatory Visit: Payer: BLUE CROSS/BLUE SHIELD | Admitting: Neurology

## 2019-12-13 ENCOUNTER — Encounter: Payer: Self-pay | Admitting: Neurology

## 2019-12-13 ENCOUNTER — Other Ambulatory Visit: Payer: Self-pay

## 2019-12-13 VITALS — BP 126/85 | HR 75 | Ht 64.0 in | Wt 168.0 lb

## 2019-12-13 DIAGNOSIS — R519 Headache, unspecified: Secondary | ICD-10-CM | POA: Diagnosis not present

## 2019-12-13 DIAGNOSIS — E538 Deficiency of other specified B group vitamins: Secondary | ICD-10-CM | POA: Diagnosis not present

## 2019-12-13 DIAGNOSIS — R51 Headache with orthostatic component, not elsewhere classified: Secondary | ICD-10-CM | POA: Diagnosis not present

## 2019-12-13 DIAGNOSIS — H538 Other visual disturbances: Secondary | ICD-10-CM

## 2019-12-13 MED ORDER — RIZATRIPTAN BENZOATE 10 MG PO TBDP: 10.0000 mg | ORAL_TABLET | ORAL | 11 refills | Status: DC | PRN

## 2019-12-13 NOTE — Progress Notes (Signed)
[facility]    Provider:  Dr Lucia Gaskins Requesting Provider: Rayvon Char, PA, Rayvon Char PA-C Primary Care Provider:  Assunta Found, MD  CC:  Headache  HPI:  Bethany Small is a 29 y.o. female here as requested by Rayvon Char, PA for headaches. I reviewed Bethany Ratajczak PA-C's notes: This is a patient with new daily persistent headaches per notes, headaches began approximately early April of this year, patient reported not waking with a headache but headaches typically present midmorning to afternoon, is in the left side, is rated at a 7 out of 10, extra strength Tylenol and Toradol followed by 5 days of naproxen without relief also Fioricet, no symptoms of photophobia, phonophobia, nausea or vomiting, she reports healthy lifestyle with diet, exercise, sleep and hydration, however she does report increased stress which may improve as the semester resolves, she had not gone to an ophthalmologist as of early May of this year, examination was normal by my review including respiratory, cardiovascular, gastrointestinal, musculoskeletal, skin and neurologic, psychiatric no acute distress, she was advised to have her eyes examined, she was given 10 tablets of Fioricet at her last note, she has reported slightly blurry vision but denied eye pain double vision, vision loss, numbness tingling or weakness she was given Toradol injection and Toradol oral, she was in the past diagnosed with B12 deficiency and she was on B12 injections every 14 days starting to feel improvement in fatigue I reviewed labs which included normal TSH, normal CBC, normal CMP with BUN 13 and creatinine 1.0, collected February 2021. LDL elevated at 151, I cannot seem to find an inititial B12 value in here or any imaging results we will request those from requesting physician's office/provider, last B12 I see was 1486.  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 Started in the beginning of April no inciting events, she never really had headaches before and the first one lasted a week, toradol shot didn't help, tylenol didn;t help, she saw her doctor.It scared her. No family history of migraines. Subsided and every weeks now she has a headaches. The headaches are in the back of the head, radiated to the left side of the head, pounding, no light or sound sensitivity, no nausea, she described the pain as moderate but could still function. Lasted up to a week on and off, in the morning would be fine, would last 6-8 hours and then resolve and start again the next day, last time she had a headache was 1.5 weeks ago and not as bad, she does not feel stress played a role in a it, nothing new or any new medications or any known triggers. At the end of 2020 she started B12 injectons, when she stopped th einjections even on oral medications her B12 went low and she is working with Dr. Phillips Odor on this. Tylenol helps now. Headaches are positional, vision changes/blurry or double vision when she has the headaches, she had her eyes checked and nothing concerning. In the upper occipital.   Reviewed notes, labs and imaging from outside physicians, which showed: see above  Review of Systems: Patient complains of symptoms per HPI as well as the following symptoms headache. Pertinent negatives and positives per HPI. All others negative.   Social History   Socioeconomic History  . Marital status:  Single    Spouse name: Not on file  . Number of children: 0  . Years of education: Not on file  . Highest education level: Bachelor's degree (e.g., BA, AB, BS)  Occupational History  . Not on file  Tobacco Use  . Smoking status: Never Smoker  . Smokeless tobacco: Never Used  Vaping Use  . Vaping Use: Never used  Substance and Sexual Activity  . Alcohol use: Yes     Comment: rarely  . Drug use: No  . Sexual activity: Never    Birth control/protection: Pill  Other Topics Concern  . Not on file  Social History Narrative   Lives alone   Right handed   Caffeine: none    Social Determinants of Health   Financial Resource Strain:   . Difficulty of Paying Living Expenses:   Food Insecurity:   . Worried About Programme researcher, broadcasting/film/video in the Last Year:   . Barista in the Last Year:   Transportation Needs:   . Freight forwarder (Medical):   Marland Kitchen Lack of Transportation (Non-Medical):   Physical Activity:   . Days of Exercise per Week:   . Minutes of Exercise per Session:   Stress:   . Feeling of Stress :   Social Connections:   . Frequency of Communication with Friends and Family:   . Frequency of Social Gatherings with Friends and Family:   . Attends Religious Services:   . Active Member of Clubs or Organizations:   . Attends Banker Meetings:   Marland Kitchen Marital Status:   Intimate Partner Violence:   . Fear of Current or Ex-Partner:   . Emotionally Abused:   Marland Kitchen Physically Abused:   . Sexually Abused:     Family History  Problem Relation Age of Onset  . Hypertension Other   . Hypertension Mother   . Diabetes type II Sister   . Cancer Neg Hx   . Headache Neg Hx   . Migraines Neg Hx     Past Medical History:  Diagnosis Date  . B12 deficiency   . Scoliosis     Patient Active Problem List   Diagnosis Date Noted  . Occipital headache - probable migraine 12/13/2019  . B12 deficiency 12/13/2019  . Candidal vulvovaginitis 05/25/2018  . Urinary tract infectious disease 05/25/2018  . Acne vulgaris 03/18/2016  . Hirsutism 03/18/2016  . Post-inflammatory hyperpigmentation 03/18/2016    Past Surgical History:  Procedure Laterality Date  . HAMMER TOE SURGERY Bilateral 2019 & 2020    Current Outpatient Medications  Medication Sig Dispense Refill  . cyanocobalamin (,VITAMIN B-12,) 1000 MCG/ML injection Inject 1,000  mcg into the muscle every 14 (fourteen) days.    . norethindrone-ethinyl estradiol (JUNEL 1/20) 1-20 MG-MCG tablet Take 1 tablet by mouth daily.    . rizatriptan (MAXALT-MLT) 10 MG disintegrating tablet Take 1 tablet (10 mg total) by mouth as needed for migraine. May repeat in 2 hours if needed 9 tablet 11   No current facility-administered medications for this visit.    Allergies as of 12/13/2019 - Review Complete 12/13/2019  Allergen Reaction Noted  . Doxycycline Rash 04/09/2011    Vitals: BP 126/85 (BP Location: Right Arm, Patient Position: Sitting)   Pulse 75   Ht 5\' 4"  (1.626 m)   Wt 168 lb (76.2 kg)   LMP 11/12/2019 (Exact Date)   BMI 28.84 kg/m  Last Weight:  Wt Readings from Last 1 Encounters:  12/13/19 168 lb (76.2  kg)   Last Height:   Ht Readings from Last 1 Encounters:  12/13/19 5\' 4"  (1.626 m)     Physical exam: Exam: Gen: NAD, conversant, well nourised, well groomed                     CV: RRR, no MRG. No Carotid Bruits. No peripheral edema, warm, nontender Eyes: Conjunctivae clear without exudates or hemorrhage  Neuro: Detailed Neurologic Exam  Speech:    Speech is normal; fluent and spontaneous with normal comprehension.  Cognition:    The patient is oriented to person, place, and time;     recent and remote memory intact;     language fluent;     normal attention, concentration,     fund of knowledge Cranial Nerves:    The pupils are equal, round, and reactive to light. The fundi are normal and spontaneous venous pulsations are present. Visual fields are full to finger confrontation. Extraocular movements are intact. Trigeminal sensation is intact and the muscles of mastication are normal. The face is symmetric. The palate elevates in the midline. Hearing intact. Voice is normal. Shoulder shrug is normal. The tongue has normal motion without fasciculations.   Coordination:    Normal finger to nose and heel to shin. Normal rapid alternating movements.    Gait:    Heel-toe and tandem gait are normal.   Motor Observation:    No asymmetry, no atrophy, and no involuntary movements noted. Tone:    Normal muscle tone.    Posture:    Posture is normal. normal erect    Strength:    Strength is V/V in the upper and lower limbs.      Sensation: intact to LT     Reflex Exam:  DTR's:    Deep tendon reflexes in the upper and lower extremities are normal bilaterally.   Toes:    The toes are downgoing bilaterally.   Clonus:    Clonus is absent.    Assessment/Plan: 29 year old female with probable new onset migraines.  However given concerning symptoms I do think she needs an MRI of the brain with and without contrast.  It is reassuring that her headache frequency is decreasing.  We will get MRI of the brain, I gave her Maxalt she should probably wait until we get MRI results before trying it.  We discussed migraine management, I gave her some literature to read and introduction on migraines and headaches, acute and preventative measures, if needed we could start topiramate as a preventative, she goes to school she will come back for a follow-up in December or contact January in the meantime from [place].  MRI brain due to concerning symptoms of occipital headaches,  positional headaches,vision changes  to look for space occupying mass, chiari or intracranial hypertension (pseudotumor), occipital mass.   Acute management: maxalt (Rizatriptan) Preventative: If needed, consider Topiramate and we discussed  Orders Placed This Encounter  Procedures  . MR BRAIN W WO CONTRAST   Meds ordered this encounter  Medications  . rizatriptan (MAXALT-MLT) 10 MG disintegrating tablet    Sig: Take 1 tablet (10 mg total) by mouth as needed for migraine. May repeat in 2 hours if needed    Dispense:  9 tablet    Refill:  11    Cc: [place], [place],  Rayvon Char, MD   Assunta Found, MD  [facility] [address] [place], [place]  Phone [phone] Fax [phone]

## 2019-12-13 NOTE — Patient Instructions (Signed)
Rizatriptan: Please take one tablet at the onset of your headache. If it does not improve the symptoms please take one additional tablet. Do not take more then 2 tablets in 24hrs. Do not take use more then 2 to 3 times in a week.  MRI of the brain w/wo contrast Rizatriptan disintegrating tablets What is this medicine? RIZATRIPTAN (rye za TRIP tan) is used to treat migraines with or without aura. An aura is a strange feeling or visual disturbance that warns you of an attack. It is not used to prevent migraines. This medicine may be used for other purposes; ask your health care provider or pharmacist if you have questions. COMMON BRAND NAME(S): Maxalt-MLT What should I tell my health care provider before I take this medicine? They need to know if you have any of these conditions:  cigarette smoker  circulation problems in fingers and toes  diabetes  heart disease  high blood pressure  high cholesterol  history of irregular heartbeat  history of stroke  kidney disease  liver disease  stomach or intestine problems  an unusual or allergic reaction to rizatriptan, other medicines, foods, dyes, or preservatives  pregnant or trying to get pregnant  breast-feeding How should I use this medicine? Take this medicine by mouth. Follow the directions on the prescription label. Leave the tablet in the sealed blister pack until you are ready to take it. With dry hands, open the blister and gently remove the tablet. If the tablet breaks or crumbles, throw it away and take a new tablet out of the blister pack. Place the tablet in the mouth and allow it to dissolve, and then swallow. Do not cut, crush, or chew this medicine. You do not need water to take this medicine. Do not take it more often than directed. Talk to your pediatrician regarding the use of this medicine in children. While this drug may be prescribed for children as young as 6 years for selected conditions, precautions do  apply. Overdosage: If you think you have taken too much of this medicine contact a poison control center or emergency room at once. NOTE: This medicine is only for you. Do not share this medicine with others. What if I miss a dose? This does not apply. This medicine is not for regular use. What may interact with this medicine? Do not take this medicine with any of the following medicines:  certain medicines for migraine headache like almotriptan, eletriptan, frovatriptan, naratriptan, rizatriptan, sumatriptan, zolmitriptan  ergot alkaloids like dihydroergotamine, ergonovine, ergotamine, methylergonovine  MAOIs like Carbex, Eldepryl, Marplan, Nardil, and Parnate This medicine may also interact with the following medications:  certain medicines for depression, anxiety, or psychotic disorders  propranolol This list may not describe all possible interactions. Give your health care provider a list of all the medicines, herbs, non-prescription drugs, or dietary supplements you use. Also tell them if you smoke, drink alcohol, or use illegal drugs. Some items may interact with your medicine. What should I watch for while using this medicine? Visit your healthcare professional for regular checks on your progress. Tell your healthcare professional if your symptoms do not start to get better or if they get worse. You may get drowsy or dizzy. Do not drive, use machinery, or do anything that needs mental alertness until you know how this medicine affects you. Do not stand up or sit up quickly, especially if you are an older patient. This reduces the risk of dizzy or fainting spells. Alcohol may interfere with the  effect of this medicine. Your mouth may get dry. Chewing sugarless gum or sucking hard candy and drinking plenty of water may help. Contact your healthcare professional if the problem does not go away or is severe. If you take migraine medicines for 10 or more days a month, your migraines may get  worse. Keep a diary of headache days and medicine use. Contact your healthcare professional if your migraine attacks occur more frequently. What side effects may I notice from receiving this medicine? Side effects that you should report to your doctor or health care professional as soon as possible:  allergic reactions like skin rash, itching or hives, swelling of the face, lips, or tongue  chest pain or chest tightness  signs and symptoms of a dangerous change in heartbeat or heart rhythm like chest pain; dizziness; fast, irregular heartbeat; palpitations; feeling faint or lightheaded; falls; breathing problems  signs and symptoms of a stroke like changes in vision; confusion; trouble speaking or understanding; severe headaches; sudden numbness or weakness of the face, arm or leg; trouble walking; dizziness; loss of balance or coordination  signs and symptoms of serotonin syndrome like irritable; confusion; diarrhea; fast or irregular heartbeat; muscle twitching; stiff muscles; trouble walking; sweating; high fever; seizures; chills; vomiting Side effects that usually do not require medical attention (report to your doctor or health care professional if they continue or are bothersome):  diarrhea  dizziness  drowsiness  dry mouth  headache  nausea, vomiting  pain, tingling, numbness in the hands or feet  stomach pain This list may not describe all possible side effects. Call your doctor for medical advice about side effects. You may report side effects to FDA at 1-800-FDA-1088. Where should I keep my medicine? Keep out of the reach of children. Store at room temperature between 15 and 30 degrees C (59 and 86 degrees F). Protect from light and moisture. Throw away any unused medicine after the expiration date. NOTE: This sheet is a summary. It may not cover all possible information. If you have questions about this medicine, talk to your doctor, pharmacist, or health care  provider.  2020 Elsevier/Gold Standard (2017-12-02 14:58:08)  Topiramate tablets What is this medicine? TOPIRAMATE (toe PYRE a mate) is used to treat seizures in adults or children with epilepsy. It is also used for the prevention of migraine headaches. This medicine may be used for other purposes; ask your health care provider or pharmacist if you have questions. COMMON BRAND NAME(S): Topamax, Topiragen What should I tell my health care provider before I take this medicine? They need to know if you have any of these conditions:  bleeding disorders  kidney disease  lung or breathing disease, like asthma  suicidal thoughts, plans, or attempt; a previous suicide attempt by you or a family member  an unusual or allergic reaction to topiramate, other medicines, foods, dyes, or preservatives  pregnant or trying to get pregnant  breast-feeding How should I use this medicine? Take this medicine by mouth with a glass of water. Follow the directions on the prescription label. Do not cut, crush or chew this medicine. Swallow the tablets whole. You can take it with or without food. If it upsets your stomach, take it with food. Take your medicine at regular intervals. Do not take it more often than directed. Do not stop taking except on your doctor's advice. A special MedGuide will be given to you by the pharmacist with each prescription and refill. Be sure to read this information carefully  each time. Talk to your pediatrician regarding the use of this medicine in children. While this drug may be prescribed for children as young as 35 years of age for selected conditions, precautions do apply. Overdosage: If you think you have taken too much of this medicine contact a poison control center or emergency room at once. NOTE: This medicine is only for you. Do not share this medicine with others. What if I miss a dose? If you miss a dose, take it as soon as you can. If your next dose is to be taken in  less than 6 hours, then do not take the missed dose. Take the next dose at your regular time. Do not take double or extra doses. What may interact with this medicine? This medicine may interact with the following medications:  acetazolamide  alcohol  antihistamines for allergy, cough, and cold  aspirin and aspirin-like medicines  atropine  birth control pills  certain medicines for anxiety or sleep  certain medicines for bladder problems like oxybutynin, tolterodine  certain medicines for depression like amitriptyline, fluoxetine, sertraline  certain medicines for seizures like carbamazepine, phenobarbital, phenytoin, primidone, valproic acid, zonisamide  certain medicines for stomach problems like dicyclomine, hyoscyamine  certain medicines for travel sickness like scopolamine  certain medicines for Parkinson's disease like benztropine, trihexyphenidyl  certain medicines that treat or prevent blood clots like warfarin, enoxaparin, dalteparin, apixaban, dabigatran, and rivaroxaban  digoxin  general anesthetics like halothane, isoflurane, methoxyflurane, propofol  hydrochlorothiazide  ipratropium  lithium  medicines that relax muscles for surgery  metformin  narcotic medicines for pain  NSAIDs, medicines for pain and inflammation, like ibuprofen or naproxen  phenothiazines like chlorpromazine, mesoridazine, prochlorperazine, thioridazine  pioglitazone This list may not describe all possible interactions. Give your health care provider a list of all the medicines, herbs, non-prescription drugs, or dietary supplements you use. Also tell them if you smoke, drink alcohol, or use illegal drugs. Some items may interact with your medicine. What should I watch for while using this medicine? Visit your doctor or health care professional for regular checks on your progress. Tell your health care professional if your symptoms do not start to get better or if they get  worse. Do not stop taking except on your health care professional's advice. You may develop a severe reaction. Your health care professional will tell you how much medicine to take. Wear a medical ID bracelet or chain. Carry a card that describes your disease and details of your medicine and dosage times. This medicine can reduce the response of your body to heat or cold. Dress warm in cold weather and stay hydrated in hot weather. If possible, avoid extreme temperatures like saunas, hot tubs, very hot or cold showers, or activities that can cause dehydration such as vigorous exercise. Check with your health care professional if you have severe diarrhea, nausea, and vomiting, or if you sweat a lot. The loss of too much body fluid may make it dangerous for you to take this medicine. You may get drowsy or dizzy. Do not drive, use machinery, or do anything that needs mental alertness until you know how this medicine affects you. Do not stand up or sit up quickly, especially if you are an older patient. This reduces the risk of dizzy or fainting spells. Alcohol may interfere with the effect of this medicine. Avoid alcoholic drinks. Tell your health care professional right away if you have any change in your eyesight. Patients and their families should watch out  for new or worsening depression or thoughts of suicide. Also watch out for sudden changes in feelings such as feeling anxious, agitated, panicky, irritable, hostile, aggressive, impulsive, severely restless, overly excited and hyperactive, or not being able to sleep. If this happens, especially at the beginning of treatment or after a change in dose, call your healthcare professional. This medicine may cause serious skin reactions. They can happen weeks to months after starting the medicine. Contact your health care provider right away if you notice fevers or flu-like symptoms with a rash. The rash may be red or purple and then turn into blisters or  peeling of the skin. Or, you might notice a red rash with swelling of the face, lips or lymph nodes in your neck or under your arms. Birth control may not work properly while you are taking this medicine. Talk to your health care professional about using an extra method of birth control. Women should inform their health care professional if they wish to become pregnant or think they might be pregnant. There is a potential for serious side effects and harm to an unborn child. Talk to your health care professional for more information. What side effects may I notice from receiving this medicine? Side effects that you should report to your doctor or health care professional as soon as possible:  allergic reactions like skin rash, itching or hives, swelling of the face, lips, or tongue  blood in the urine  changes in vision  confusion  loss of memory  pain in lower back or side  pain when urinating  redness, blistering, peeling or loosening of the skin, including inside the mouth  signs and symptoms of bleeding such as bloody or black, tarry stools; red or dark brown urine; spitting up blood or brown material that looks like coffee grounds; red spots on the skin; unusual bruising or bleeding from the eyes, gums, or nose  signs and symptoms of increased acid in the body like breathing fast; fast heartbeat; headache; confusion; unusually weak or tired; nausea, vomiting  suicidal thoughts, mood changes  trouble speaking or understanding  unusual sweating  unusually weak or tired Side effects that usually do not require medical attention (report to your doctor or health care professional if they continue or are bothersome):  dizziness  drowsiness  fever  loss of appetite  nausea, vomiting  pain, tingling, numbness in the hands or feet  stomach pain  tiredness  upset stomach This list may not describe all possible side effects. Call your doctor for medical advice about side  effects. You may report side effects to FDA at 1-800-FDA-1088. Where should I keep my medicine? Keep out of the reach of children. Store at room temperature between 15 and 30 degrees C (59 and 86 degrees F). Throw away any unused medicine after the expiration date. NOTE: This sheet is a summary. It may not cover all possible information. If you have questions about this medicine, talk to your doctor, pharmacist, or health care provider.  2020 Elsevier/Gold Standard (2018-12-17 15:07:20)

## 2019-12-15 ENCOUNTER — Telehealth: Payer: Self-pay | Admitting: Neurology

## 2019-12-15 NOTE — Telephone Encounter (Signed)
LVM for pt to call back about scheduling mri  [employer] Auth: NPR Ref # [id number]

## 2019-12-16 NOTE — Telephone Encounter (Signed)
Pt returned call. Please call back when available. 

## 2019-12-20 NOTE — Telephone Encounter (Signed)
I called the patient and got her scheduled for her MRI on 7/27 at 7:30.

## 2019-12-25 NOTE — Telephone Encounter (Signed)
Noted, thank you

## 2019-12-28 ENCOUNTER — Other Ambulatory Visit: Payer: BLUE CROSS/BLUE SHIELD

## 2019-12-28 ENCOUNTER — Ambulatory Visit: Payer: BLUE CROSS/BLUE SHIELD

## 2019-12-28 DIAGNOSIS — H538 Other visual disturbances: Secondary | ICD-10-CM

## 2019-12-28 DIAGNOSIS — R51 Headache with orthostatic component, not elsewhere classified: Secondary | ICD-10-CM | POA: Diagnosis not present

## 2019-12-28 DIAGNOSIS — R519 Headache, unspecified: Secondary | ICD-10-CM

## 2019-12-28 MED ORDER — GADOBENATE DIMEGLUMINE 529 MG/ML IV SOLN
15.0000 mL | Freq: Once | INTRAVENOUS | Status: AC | PRN
  Administered 2019-12-28: 08:00:00 15 mL via INTRAVENOUS

## 2020-02-08 ENCOUNTER — Ambulatory Visit: Payer: BLUE CROSS/BLUE SHIELD | Admitting: Cardiovascular Disease

## 2020-02-14 ENCOUNTER — Encounter: Payer: Self-pay | Admitting: Family Medicine

## 2020-03-13 ENCOUNTER — Telehealth: Payer: Self-pay | Admitting: Cardiology

## 2020-03-13 ENCOUNTER — Ambulatory Visit: Payer: BLUE CROSS/BLUE SHIELD | Attending: Cardiology | Admitting: Cardiology

## 2020-03-13 ENCOUNTER — Ambulatory Visit
Admission: RE | Admit: 2020-03-13 | Discharge: 2020-03-13 | Disposition: A | Discharge status: Home / Self Care | Payer: BLUE CROSS/BLUE SHIELD | Source: Ambulatory Visit

## 2020-03-13 ENCOUNTER — Encounter: Payer: Self-pay | Admitting: Cardiology

## 2020-03-13 VITALS — BP 146/100 | HR 83 | Ht 64.0 in | Wt 164.0 lb

## 2020-03-13 DIAGNOSIS — Z789 Other specified health status: Secondary | ICD-10-CM | POA: Insufficient documentation

## 2020-03-13 DIAGNOSIS — R9431 Abnormal electrocardiogram [ECG] [EKG]: Secondary | ICD-10-CM | POA: Insufficient documentation

## 2020-03-13 DIAGNOSIS — R002 Palpitations: Secondary | ICD-10-CM | POA: Insufficient documentation

## 2020-03-13 NOTE — Telephone Encounter (Signed)
[facility] Provider to book appointment with:RW    Reason for Appointment:echo and fuv     Time Frame patient needs to be seen in:1 mo    Can Patient go to either office? N  Office Preference:[facility]     Who are we contacting with appt information:patient   Best contact phone number:[phone]    Is this a new patient? N    Who is requesting appointment:RW     Additional Comments:due to work schedule patient requesting a Wed or Fri after 1pm

## 2020-03-13 NOTE — Progress Notes (Signed)
[facility]        Cardiology Office Consult Note    Date of Consult: 03/13/2020 Patient: Barbara Riggs   Patients PCP: Hinda Kehr, DO Patient DOB: [birth year]  EMRN: [id number]     History of Present Illness/Reason For Visit     I had the pleasure of seeing Barbara Riggs in cardiology consult on 03/13/2020. Barbara Riggs is an 29 y.o. female who we were asked to see for palpitations; no prior cardiac history; recently moved to [place]; has several months brief episodes rapid heart beat; occur daily; not related to activity; feels ok with activity. Tolerates Rx.     Past Medical and Surgical History     Past Medical History:   Diagnosis Date    B12 deficiency      Past Surgical History:   Procedure Laterality Date    HAMMER TOE SURGERY         Medications and Allergies     Current Outpatient Medications   Medication Sig    cyanocobalamin (VITAMIN B-12) 1000 MCG tablet Take 1,000 mcg by mouth daily    norethindrone-ethinyl estradiol (JUNEL FE 1/20) 1-20 MG-MCG tablet Take 1 tablet by mouth daily     She is allergic to doxycycline.    Social and Family History     Family History   Problem Relation Age of Onset    Arrhythmia Mother      Social History     Socioeconomic History    Marital status: Single     Spouse name: Not on file    Number of children: Not on file    Years of education: Not on file    Highest education level: Not on file   Occupational History    Not on file   Tobacco Use    Smoking status: Never Smoker    Smokeless tobacco: Never Used   Substance and Sexual Activity    Alcohol use: Not on file    Drug use: Not on file    Sexual activity: Not on file   Social History Narrative    Not on file         Review of Systems     ROS  Vitals and Physical Exam     Barbara Riggs's  height is 1.626 m (5\' 4" ) and weight is 74.4 kg (164 lb). Her blood pressure is 146/100 (abnormal) and her pulse is 83. Her oxygen saturation is 100%.  Body mass index is 28.15 kg/m.    Physical  Exam  Constitutional:       General: She is not in acute distress.     Appearance: She is well-developed.   HENT:      Head: Normocephalic and atraumatic.   Neck:      Vascular: No JVD.   Cardiovascular:      Rate and Rhythm: Normal rate and regular rhythm.      Heart sounds: Normal heart sounds. No murmur heard.  No friction rub. No gallop.    Pulmonary:      Effort: Pulmonary effort is normal. No respiratory distress.      Breath sounds: Normal breath sounds. No wheezing or rales.   Skin:     General: Skin is warm.   Neurological:      Mental Status: She is alert.       Laboratory Data     Hematology:   No results found for requested labs within last 730 days.     Chemistry:   No  results found for requested labs within last 730 days.     Coagulation Studies:   No results found for requested labs within last 730 days.     Cardiac:   No results found for requested labs within last 730 days.     Lipids:   No results found for requested labs within last 730 days.       Cardiac/Imaging Data & Risk Scores     ECG: NSR, normal ECG                       Impression and Plan     Patient Active Problem List   Diagnosis Code    Palpitation R00.2       This is an 29 y.o. female with palpitations; exam and ECG ok; will proceed with Holter and echocardiogram and labs. Plan explained and accepted.          Geralyn Corwin, MD  Electronically signed on 03/13/2020 at 2:55 PM.

## 2020-03-14 ENCOUNTER — Other Ambulatory Visit
Admission: RE | Admit: 2020-03-14 | Discharge: 2020-03-14 | Disposition: A | Discharge status: Home / Self Care | Payer: BLUE CROSS/BLUE SHIELD | Source: Ambulatory Visit | Attending: Cardiology | Admitting: Cardiology

## 2020-03-14 DIAGNOSIS — R002 Palpitations: Secondary | ICD-10-CM | POA: Insufficient documentation

## 2020-03-14 LAB — HEPATIC FUNCTION PANEL
ALT: 25 U/L (ref 0–35)
AST: 22 U/L (ref 0–35)
Albumin: 4.8 g/dL (ref 3.5–5.2)
Alk Phos: 69 U/L (ref 35–105)
Bilirubin,Direct: <0.2 mg/dL (ref 0.0–0.3)
Bilirubin,Total: 0.5 mg/dL (ref 0.0–1.2)
Total Protein: 7.5 g/dL (ref 6.3–7.7)

## 2020-03-14 LAB — CBC
Hematocrit: 40 % (ref 34–45)
Hemoglobin: 13.3 g/dL (ref 11.2–15.7)
MCH: 31 pg (ref 26–32)
MCHC: 33 g/dL (ref 32–36)
MCV: 93 fL (ref 79–95)
Platelets: 307 10*3/uL (ref 160–370)
RBC: 4.3 MIL/uL (ref 3.9–5.2)
RDW: 11.7 % (ref 11.7–14.4)
WBC: 10 10*3/uL (ref 4.0–10.0)

## 2020-03-14 LAB — MAGNESIUM: Magnesium: 2 mg/dL (ref 1.6–2.5)

## 2020-03-14 LAB — BASIC METABOLIC PANEL
Anion Gap: 15 (ref 7–16)
CO2: 24 mmol/L (ref 20–28)
Calcium: 9.9 mg/dL (ref 8.8–10.2)
Chloride: 102 mmol/L (ref 96–108)
Creatinine: 0.9 mg/dL (ref 0.51–0.95)
GFR,Black: 100 *
GFR,Caucasian: 87 *
Glucose: 84 mg/dL (ref 60–99)
Lab: 10 mg/dL (ref 6–20)
Potassium: 4.3 mmol/L (ref 3.3–5.1)
Sodium: 141 mmol/L (ref 133–145)

## 2020-03-14 LAB — LIPID PANEL
Chol/HDL Ratio: 4
Cholesterol: 231 mg/dL — AB
HDL: 58 mg/dL (ref 40–60)
LDL Calculated: 160 mg/dL — AB
Non HDL Cholesterol: 173 mg/dL
Triglycerides: 66 mg/dL

## 2020-03-14 LAB — TSH: TSH: 2.25 u[IU]/mL (ref 0.27–4.20)

## 2020-03-14 LAB — EKG 12-LEAD
P: 59 deg
PR: 127 ms
QRS: 38 deg
QRSD: 63 ms
QT: 346 ms
QTc: 408 ms
Rate: 83 {beats}/min
T: 39 deg

## 2020-03-14 NOTE — Telephone Encounter (Signed)
LM on pt's AM to call(11/19)

## 2020-03-17 DIAGNOSIS — R002 Palpitations: Secondary | ICD-10-CM

## 2020-03-17 LAB — HOLTER MONITOR - 48 HOUR
AF Count: 0
AIVR/IVR Runs: 0
Analysis Time: 0:0 {titer}
Analyze Time Length Hours: 48
Analyze Time Length Minutes: 1
Bigeminy: 0
Bradycardia Runs: 0
Couplet: 0
Max Heart Rate: 155
Mean Heart Rate: 88
Min Heart Rate: 60
Paced Beats Percent: 0 %
Paced Beats Total: 0
Paced Beats: 0
Pauses: 0
R on T: 0
SVE Max Per Hour: 0
SVE Percent Beats: 0 %
SVE Total Beats: 0
SVT Runs: 0
Tachycardia Runs: 0
Total Beats: 252529
Trigeminy: 0
Triplet: 0
VE Max Per Hour Time: 14:0 {titer}
VE Max Per Hour: 13
VE Percent Beats: 0.03 %
VE Total Beats: 88
VT Runs: 0

## 2020-03-17 NOTE — Telephone Encounter (Signed)
Pt must have called back and scheduled

## 2020-04-05 ENCOUNTER — Ambulatory Visit
Admission: RE | Admit: 2020-04-05 | Discharge: 2020-04-05 | Disposition: A | Discharge status: Home / Self Care | Payer: BLUE CROSS/BLUE SHIELD | Source: Ambulatory Visit | Attending: Cardiology | Admitting: Cardiology

## 2020-04-05 DIAGNOSIS — R002 Palpitations: Secondary | ICD-10-CM | POA: Insufficient documentation

## 2020-04-06 LAB — ECHO COMPLETE
Aortic Arch Diameter: 2.4 cm
Aortic Diameter (mid tubular): 2.8 cm
Aortic Diameter (sinus of Valsalva): 2.3 cm
BMI: 28.2 kg/m2
BP Diastolic: 65 mmHg
BP Systolic: 106 mmHg
BSA: 1.83 m2
Card Orifice MV Area: 4.15 cm2
Cardiovascular Orifice Diameter - Left Vent 2D: 2.54 cm2
Deceleration Slope: 408 cm/s2
Deceleration Slope: 408 cm/s2
Deceleration Time - MV: 180 ms
E/A ratio: 1.09
EF: 65 %
Heart Rate: 91 {beats}/min
Height: 64 in
IVC Diameter: 1.6 cm
LA Systolic Diameter: 2.6 cm
LA Systolic Vol BSA Index: 10.9 mL/m2
LA Systolic Vol Height Index: 12.3 mL/m
LA Systolic Volume: 20 mL
LV ASE Mass BSA Index: 64.6 gm/m2
LV ASE Mass Height 2.7 Index: 31.8 gm/m2.7
LV ASE Mass Height Index: 72.7 gm/m
LV ASE Mass: 118.2 gm
LV Posterior Wall Thickness: 1.1 cm
LV Septal Thickness: 0.8 cm
LV wall/cavity ratio: 0.48
LVED Diameter BSA Index: 2.19 cm/m2
LVED Diameter Height Index: 2.46 cm/m
LVED Diameter: 4 cm
LVOT Area (calculated): 2.54 cm2
LVOT Cardiac Index: 3 L/min/m2
LVOT Cardiac Output: 5.49 L/min
LVOT Diameter: 1.8 cm
LVOT PWD VTI: 23.7 cm
LVOT PWD Velocity (mean): 75.7 cm/s
LVOT PWD Velocity (peak): 108 cm/s
LVOT SV BSA Index: 32.94 mL/m2
LVOT SV Height Index: 37.1 mL/m
LVOT Stroke Rate (mean): 192.5 mL/s
LVOT Stroke Rate (peak): 274.7 mL/s
LVOT Stroke Volume: 60.28 cc
MV Area (PHT) BSA Index: 2.27 cm2/m
MV Area (PHT): 4.15 cm2
MV Peak A Velocity: 66.9 cm/s
MV Peak E Velocity: 73.2 cm/s
Mean Gradient: 2 mmHg
Mean Gradient: 3 mmHg
Mean Gradient: 3 mmHg
Mitral Annular E/Ea Vel Ratio: 6.1
Mitral Annular Ea Velocity: 12 cm/s
Mitral Valve E to A Ratio: 1.1 no units
Peak Gradient - LV: 5 mmHg
Peak Velocity - LV: 103 cm/s
Peak Velocity - LV: 113 cm/s
Pressure Half-Time - MV: 53 ms
RA Volume BSA Index: 9.3 mL/m2
RA Volume Height Index: 10.5 mL/m
RA Volume: 17 mL
RR Interval: 659.34 ms
Weight (lbs): 164 [lb_av]
Weight: 2624 oz

## 2020-04-12 ENCOUNTER — Ambulatory Visit: Payer: BLUE CROSS/BLUE SHIELD | Admitting: Cardiology

## 2020-04-12 ENCOUNTER — Encounter: Payer: Self-pay | Admitting: Cardiology

## 2020-04-12 VITALS — BP 128/90 | HR 88 | Ht 64.0 in | Wt 161.0 lb

## 2020-04-12 DIAGNOSIS — R002 Palpitations: Secondary | ICD-10-CM

## 2020-04-12 NOTE — Progress Notes (Signed)
[facility]        Cardiology Office Revisit Note    Date of Visit: 04/12/2020 Patient: Barbara Riggs   Patients PCP: Hinda Kehr, DO Patient DOB: 06-Mar-1991  EMRN: [id number]     Subjective/Reason For Visit     I had the pleasure of seeing Barbara Riggs in cardiology followup on 04/12/2020. Stable since seen; less palpitations. Holter shows rare VPCs that correlate to palpitations and echo is normal.    Past Medical History:   Diagnosis Date    B12 deficiency      Past Surgical History:   Procedure Laterality Date    HAMMER TOE SURGERY       ROS  Medications     Current Outpatient Medications   Medication Sig    cyanocobalamin (VITAMIN B-12) 1000 MCG tablet Take 1,000 mcg by mouth daily    norethindrone-ethinyl estradiol (JUNEL FE 1/20) 1-20 MG-MCG tablet Take 1 tablet by mouth daily     Vitals and Physical Exam     Barbara Riggs's  height is 1.626 m (5\' 4" ) and weight is 73 kg (161 lb). Her blood pressure is 128/80 and her pulse is 88. Her oxygen saturation is 100%.  Body mass index is 27.64 kg/m.    Physical Exam  Laboratory Data     Hematology:   Results in Past 730 Days  Result Component Current Result Previous Result   WBC 10.0 (03/14/2020) Not in Time Range   Hemoglobin 13.3 (03/14/2020) Not in Time Range   Hematocrit 40 (03/14/2020) Not in Time Range   Platelets 307 (03/14/2020) Not in Time Range     Chemistry:   Results in Past 730 Days  Result Component Current Result Previous Result   Sodium 141 (03/14/2020) Not in Time Range   Potassium 4.3 (03/14/2020) Not in Time Range   Creatinine 0.90 (03/14/2020) Not in Time Range   Glucose 84 (03/14/2020) Not in Time Range   Calcium 9.9 (03/14/2020) Not in Time Range   Magnesium 2.0 (03/14/2020) Not in Time Range   AST 22 (03/14/2020) Not in Time Range   ALT 25 (03/14/2020) Not in Time Range   TSH 2.25 (03/14/2020) Not in Time Range     Coagulation Studies:   No results found for requested labs within last 730 days.     Cardiac:   No results found  for requested labs within last 730 days.     Lipids:   Results in Past 730 Days  Result Component Current Result Previous Result   Cholesterol 231 (!) (03/14/2020) Not in Time Range   HDL 58 (03/14/2020) Not in Time Range   Triglycerides 66 (03/14/2020) Not in Time Range   LDL Calculated 160 (!) (03/14/2020) Not in Time Range   Chol/HDL Ratio 4.0 (03/14/2020) Not in Time Range     Cardiac/Imaging Data & Risk Scores     ECG:          ECHO COMPLETE 04/06/2020    Interpretation Summary  Normal LVEF without significant wall motion abnormalities. No significant valvular abnormalities. Normal right heart size and function with estimated normal RV systolic pressures.               ZZHOLTER MONITOR - 48 HOUR 03/17/2020    Narrative  Holter Summary    Total Beats 252,529 Recording Date 03/13/20  Length Recorded 48 hours 1 minutes Analysis Date 03/17/20    Overall Rates  Maximum HR 155 bpm on 03/15/20  Mean HR 88 bpm  Minimum  HR 60 bpm on 03/15/20    Ectopy    PVC Beats   PAC Beats  Count 88  Count 0  Percent 0.03 %  Percent 0.00 %  Max/Hr 13 on 03/14/20 14:0  Max/Hr 0 on 03/13/20 13:59    Pauses 0 Longest  on    Ventricular Arrhythmias Supraventricular Arrhythmias    VT   SVT 0  Longest  on    Longest  on  Max Rate  on    Max Rate  on  Couplet 0  PAC Couplet  Triplet 0  R on T 0  Bigeminy 0  Trigeminy 0    Symptoms/Comments:  SEE [facility] FOR DIARY EVENTS.        Holter Conclusion    Sinus rhythm/tachycardia. Rare VPCs which correspond to symptoms.      To view the entire scanned Holter tracings in eRecord please click on the blue hyperlink below under the Scans on Order section.             Impression and Plan     Patient Active Problem List   Diagnosis Code    Palpitation R00.2       This is an 29 y.o. female with VPCs that cause palpitation; no Rx needed. I can see prn. Plan explained and accepted.          Geralyn Corwin, MD  Electronically signed on 04/12/2020 at 8:51 AM.

## 2020-05-22 ENCOUNTER — Ambulatory Visit: Payer: BLUE CROSS/BLUE SHIELD | Admitting: Family Medicine

## 2020-05-29 ENCOUNTER — Ambulatory Visit: Payer: BLUE CROSS/BLUE SHIELD | Admitting: Family Medicine

## 2020-11-09 ENCOUNTER — Other Ambulatory Visit: Payer: Self-pay

## 2020-11-13 ENCOUNTER — Ambulatory Visit: Payer: BLUE CROSS/BLUE SHIELD | Admitting: Podiatry

## 2020-11-13 ENCOUNTER — Other Ambulatory Visit: Payer: Self-pay

## 2020-11-13 ENCOUNTER — Ambulatory Visit (INDEPENDENT_AMBULATORY_CARE_PROVIDER_SITE_OTHER): Payer: BLUE CROSS/BLUE SHIELD

## 2020-11-13 DIAGNOSIS — M722 Plantar fascial fibromatosis: Secondary | ICD-10-CM

## 2020-11-13 MED ORDER — TRIAMCINOLONE ACETONIDE 10 MG/ML IJ SUSP
20.0000 mg | Freq: Once | INTRAMUSCULAR | Status: AC
  Administered 2020-11-13: 10:00:00 20 mg

## 2020-11-13 NOTE — Patient Instructions (Signed)

## 2020-11-14 NOTE — Progress Notes (Signed)
Subjective:   Patient ID: Bethany Small, female   DOB: 30 y.o.   MRN: [id number]   HPI Patient presents stating she is developed a lot of pain in the bottom of both heels that is been going on the last couple months and admits she has been wearing flat shoes no   ROS      Objective:  Physical Exam  Neurovascular status intact with acute inflammation of the plantar heel region bilateral at the insertion Tendon into the calcaneus with fluid buildup around the medial band     Assessment:  Acute Planter fasciitis bilateral with inflammation fluid     Plan:  H&P x-rays reviewed sterile prep injected the plantar fascial bilateral 3 mg Kenalog 5 mg Xylocaine advised on supportive therapy anti-inflammatories shoe gear modification reappoint to recheck as indicated  X-rays indicate that moderate depression of the arch no indications of fracture no indication to spur formation

## 2020-11-15 ENCOUNTER — Encounter: Payer: Self-pay | Admitting: Podiatry

## 2020-11-16 ENCOUNTER — Other Ambulatory Visit: Payer: Self-pay | Admitting: Podiatry

## 2020-11-16 MED ORDER — DICLOFENAC SODIUM 75 MG PO TBEC: 75.0000 mg | DELAYED_RELEASE_TABLET | Freq: Two times a day (BID) | ORAL | 2 refills | Status: DC

## 2021-01-07 ENCOUNTER — Other Ambulatory Visit: Payer: Self-pay | Admitting: Podiatry

## 2021-01-09 NOTE — Telephone Encounter (Signed)
Please Advise

## 2021-01-25 ENCOUNTER — Other Ambulatory Visit: Payer: Self-pay | Admitting: Podiatry

## 2021-01-25 NOTE — Telephone Encounter (Signed)
Please advise 

## 2021-01-26 NOTE — Telephone Encounter (Signed)
I will refill but patient should come in to be seen if still having pain

## 2021-04-10 ENCOUNTER — Other Ambulatory Visit: Payer: Self-pay | Admitting: Podiatry

## 2021-04-16 NOTE — Telephone Encounter (Signed)
Patient is needing a medication refill f/u appointment, has not been seen since 11/13/20(Diclofenac-75 mg)

## 2021-04-17 NOTE — Telephone Encounter (Signed)
Spoke with pt and she stated that she wanted to cancel the refill request and will call back to schedule as needed.

## 2021-04-20 NOTE — Telephone Encounter (Signed)
Patient will call back to schedule ?

## 2021-04-24 NOTE — Telephone Encounter (Signed)
Patient is canceling her request for a refill

## 2021-05-02 NOTE — Telephone Encounter (Signed)
Medication is refused

## 2021-11-09 ENCOUNTER — Ambulatory Visit: Payer: BLUE CROSS/BLUE SHIELD | Admitting: Cardiology

## 2021-11-09 ENCOUNTER — Encounter: Payer: Self-pay | Admitting: Cardiology

## 2021-11-09 VITALS — BP 123/78 | HR 82 | Temp 97.4°F | Resp 16 | Ht 64.0 in | Wt 176.2 lb

## 2021-11-09 DIAGNOSIS — R002 Palpitations: Secondary | ICD-10-CM

## 2021-11-09 DIAGNOSIS — E7800 Pure hypercholesterolemia, unspecified: Secondary | ICD-10-CM

## 2021-11-09 DIAGNOSIS — I10 Essential (primary) hypertension: Secondary | ICD-10-CM

## 2021-11-09 NOTE — Patient Instructions (Signed)
Premature ventricular complexes (PVC) means extra heartbeat from the lower chamber of the heart.  These are very common and are not dangerous.  Extra skipped beat coming from the bottom chamber (ventricle) and mostly are life altering (nuisance) than life threatening and mostly treated by reassurance.  There may not be any specific reasons for this, however patients with excessive caffeine, anxiety, lack of sleep, alcohol or thyroid problems can have these episodes.  Rarely patients with block coronary arteries or family history of heart muscle disease may be the reason.  If more questions, he can discuss with her doctor on office visit.  

## 2021-11-09 NOTE — Progress Notes (Signed)
Primary Physician/Referring:  Sharilyn Sites, MD  Patient ID: Bethany Small, female    DOB: 1991-01-20, 31 y.o.   MRN: [id number]  Chief Complaint  Patient presents with   Palpitations   New Patient (Initial Visit)    Referred by Dr. Caren Macadam   HPI:    Bethany Small  is a 31 y.o.  female patient referred to me for evaluation of palpitations, she has a past medical history of B12 deficiency and hypertension.  Patient started having episodes of palpitations a month ago, described as skipped beats that occurred mostly during rest than with physical activity.  She was also seen in the emergency room in [place].  States that since she was started on amlodipine for blood pressure, symptoms improved dramatically, states that she still has occasional episodes of palpitation lasting a second or 2 maybe 1 or 2 episodes a day or less.  No other associated symptoms.  Past Medical History:  Diagnosis Date   B12 deficiency    Scoliosis    Past Surgical History:  Procedure Laterality Date   HAMMER TOE SURGERY Bilateral 2019 & 2020   Family History  Problem Relation Age of Onset   Hypertension Mother    Diabetes type II Sister    Hypertension Other    Cancer Neg Hx    Headache Neg Hx    Migraines Neg Hx     Social History   Tobacco Use   Smoking status: Never   Smokeless tobacco: Never  Substance Use Topics   Alcohol use: Not Currently    Comment: rarely   Marital Status: Single  ROS  Review of Systems  Cardiovascular:  Negative for chest pain, dyspnea on exertion and leg swelling.   Objective  Blood pressure 123/78, pulse 82, temperature (!) 97.4 F (36.3 C), temperature source Temporal, resp. rate 16, height $RemoveBe'5\' 4"'vCAmyYEpP$  (1.626 m), weight 176 lb 3.2 oz (79.9 kg), SpO2 100 %. Body mass index is 30.24 kg/m.     11/09/2021    9:20 AM 12/13/2019    8:02 AM 11/27/2019    1:30 PM  Vitals with BMI  Height $Remov'5\' 4"'UVIzYn$  $Remove'5\' 4"'KikYpoX$    Weight 176 lbs 3 oz 168 lbs   BMI 99.83 38.25   Systolic  053 976 734  Diastolic 78 85 79  Pulse 82 75 99    Physical Exam Neck:     Vascular: No JVD.  Cardiovascular:     Rate and Rhythm: Normal rate and regular rhythm.     Pulses: Intact distal pulses.     Heart sounds: Normal heart sounds. No murmur heard.    No gallop.  Pulmonary:     Effort: Pulmonary effort is normal.     Breath sounds: Normal breath sounds.  Abdominal:     General: Bowel sounds are normal.     Palpations: Abdomen is soft.  Musculoskeletal:     Right lower leg: No edema.     Left lower leg: No edema.     Medications and allergies   Allergies  Allergen Reactions   Doxycycline Rash     Medication list after today's encounter   Current Outpatient Medications:    amLODipine (NORVASC) 5 MG tablet, Take 5 mg by mouth daily., Disp: , Rfl:    Cyanocobalamin (VITAMIN B 12 PO), Take 1 tablet by mouth daily at 12 noon., Disp: , Rfl:    norethindrone-ethinyl estradiol (JUNEL 1/20) 1-20 MG-MCG tablet, Take 1 tablet by mouth daily., Disp: ,  Rfl:   Laboratory examination:   External labs:   Cholesterol, total 214.000 m 10/11/2020 HDL 56.000 mg 10/11/2020 LDL 146.000 m 10/11/2020 Triglycerides 69.000 mg 10/11/2020  A1C 5.100 04/16/2019  Labs 09/21/2021:  Vitamin D 37.8, vitamin B12 398.  BUN 10, creatinine 0.91, EGFR 87 mm, potassium 4.2, LFTs normal.  Serum glucose 82 mg.  Hb 13.2/HCT 39.4, platelets 288.  TSH normal at 3.18.  Radiology:    Cardiac Studies:    EKG:   EKG 11/09/2021: Normal sinus rhythm at rate of 81 bpm, normal axis, normal EKG.    Assessment     ICD-10-CM   1. Palpitations  R00.2 EKG 12-Lead    2. Primary hypertension  I10     3. Pure hypercholesterolemia  E78.00        Medications Discontinued During This Encounter  Medication Reason   diclofenac (VOLTAREN) 75 MG EC tablet     No orders of the defined types were placed in this encounter.  Orders Placed This Encounter  Procedures   EKG 12-Lead   Recommendations:    Bethany Small is a 31 y.o.   female patient referred to me for evaluation of palpitations, she has a past medical history of B12 deficiency and hypertension.  Patient started having episodes of palpitations a month ago, described as skipped beats that occurred mostly during rest than with physical activity.  She was also seen in the emergency room in [place].  Her symptoms are most consistent with PACs and PVCs.  Do not suspect significant arrhythmias.  There is no family history of sudden cardiac death.  Patient has gained about 20 to 30 pounds in weight over the past year or so, and she was also found to be hypertensive and was started on amlodipine.  Since then her symptoms are improved significantly, states that the palpitations are very few and does not bother her as much.  In view of normal EKG, normal physical exam and no family history of sudden cardiac death, symptoms improved on blood pressure management, no further evaluation is indicated.  I will see her back on a as needed basis.  She has been taking vitamin B12 supplement for mild vitamin B12 deficiency, advised her to switch from oral medication to chewable vitamin B12 for better absorption, could also help with palpitations.  External labs reviewed.  Her LDL is elevated and HDL is <60 for her age.  Risk factors of obesity discussed with the patient, would have a low threshold to start her on statin therapy if LDL continues to remain elevated.    Adrian Prows, MD, [facility] 11/09/2021, 10:16 AM Office: [phone]

## 2023-01-15 ENCOUNTER — Ambulatory Visit: Payer: BLUE CROSS/BLUE SHIELD | Admitting: Podiatry

## 8413-10-01 DEATH — deceased
